# Patient Record
Sex: Female | Born: 1975
Health system: Southern US, Community
[De-identification: ages and names within clinical notes are randomized; demographics above are authoritative.]

## PROBLEM LIST (undated history)

## (undated) DIAGNOSIS — F329 Major depressive disorder, single episode, unspecified: Secondary | ICD-10-CM

## (undated) DIAGNOSIS — J45909 Unspecified asthma, uncomplicated: Secondary | ICD-10-CM

## (undated) DIAGNOSIS — F419 Anxiety disorder, unspecified: Secondary | ICD-10-CM

## (undated) DIAGNOSIS — A749 Chlamydial infection, unspecified: Secondary | ICD-10-CM

## (undated) DIAGNOSIS — I10 Essential (primary) hypertension: Secondary | ICD-10-CM

## (undated) DIAGNOSIS — R87619 Unspecified abnormal cytological findings in specimens from cervix uteri: Secondary | ICD-10-CM

## (undated) DIAGNOSIS — E7800 Pure hypercholesterolemia, unspecified: Secondary | ICD-10-CM

## (undated) DIAGNOSIS — F32A Depression, unspecified: Secondary | ICD-10-CM

## (undated) DIAGNOSIS — M1990 Unspecified osteoarthritis, unspecified site: Secondary | ICD-10-CM

## (undated) DIAGNOSIS — N83209 Unspecified ovarian cyst, unspecified side: Secondary | ICD-10-CM

## (undated) DIAGNOSIS — N3281 Overactive bladder: Secondary | ICD-10-CM

## (undated) HISTORY — DX: Pure hypercholesterolemia, unspecified: E78.00

## (undated) HISTORY — DX: Overactive bladder: N32.81

## (undated) HISTORY — DX: Essential (primary) hypertension: I10

## (undated) HISTORY — DX: Unspecified asthma, uncomplicated: J45.909

## (undated) HISTORY — PX: TUBAL LIGATION: SHX77

## (undated) HISTORY — DX: Unspecified osteoarthritis, unspecified site: M19.90

## (undated) HISTORY — DX: Anxiety disorder, unspecified: F41.9

## (undated) HISTORY — DX: Major depressive disorder, single episode, unspecified: F32.9

## (undated) HISTORY — DX: Unspecified ovarian cyst, unspecified side: N83.209

## (undated) HISTORY — DX: Depression, unspecified: F32.A

## (undated) HISTORY — DX: Unspecified abnormal cytological findings in specimens from cervix uteri: R87.619

## (undated) HISTORY — DX: Chlamydial infection, unspecified: A74.9

---
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
## 2015-02-10 LAB — HM MAMMOGRAPHY

## 2016-01-22 DIAGNOSIS — A749 Chlamydial infection, unspecified: Secondary | ICD-10-CM

## 2016-01-22 HISTORY — DX: Chlamydial infection, unspecified: A74.9

## 2016-02-11 LAB — HM PAP SMEAR

## 2016-09-15 ENCOUNTER — Ambulatory Visit (INDEPENDENT_AMBULATORY_CARE_PROVIDER_SITE_OTHER): Payer: BLUE CROSS/BLUE SHIELD | Admitting: Obstetrics and Gynecology

## 2016-09-15 ENCOUNTER — Encounter: Payer: Self-pay | Admitting: Obstetrics and Gynecology

## 2016-09-15 VITALS — BP 112/82 | HR 117 | Ht 64.0 in | Wt 158.0 lb

## 2016-09-15 DIAGNOSIS — Z113 Encounter for screening for infections with a predominantly sexual mode of transmission: Secondary | ICD-10-CM

## 2016-09-15 DIAGNOSIS — R1032 Left lower quadrant pain: Secondary | ICD-10-CM

## 2016-09-15 NOTE — Progress Notes (Signed)
Chief Complaint  Patient presents with  . Pelvic Pain    left side pain x2 weeks    HPI:      Ms. Tammy Marshall is a 41 y.o. [id number] who LMP was Patient's last menstrual period was 08/25/2016., presents today for LLQ throbbing pain for the past 2 wks. It is constant, but the intensity is less today than a few days ago. She denies any urin, vag, or GI sx. She had GI bug a few days before sx started, however, and still doesn't feel "right". She vomited randomly a couple days ago. No fevers. No hx of ovar cysts. No aggrav sx, sx improved with NSAIDs.  She is sex active, s/p TL, no new sex partners, but had chlamydia dx at 12/17 annual and was treated with azithro. Never had TOC.  Menses are monthly, last 7 days, occas mid cycle spotting, pos dysmen relieved with naproxen.    Past Medical History:  Diagnosis Date  . Abnormal Pap smear of cervix   . Asthma   . Chlamydia 01/2016  . Depression   . Hypercholesterolemia   . Hypertension   . Overactive bladder     Past Surgical History:  Procedure Laterality Date  . DILATION AND CURETTAGE OF UTERUS  1995  . TUBAL LIGATION      Family History  Problem Relation Age of Onset  . Arthritis Mother   . Asthma Mother   . Cancer Mother        cervical  . Cancer Father        bladder, unknown  . Hyperlipidemia Father   . Hypertension Father   . Heart attack Father        old/healed  . Arthritis Maternal Grandmother   . Arthritis Paternal Grandmother   . Cancer Paternal Grandmother        unknown    Social History   Social History  . Marital status: Single    Spouse name: N/A  . Number of children: 2  . Years of education: N/A   Occupational History  . Not on file.   Social History Main Topics  . Smoking status: Current Every Day Smoker    Types: Cigarettes  . Smokeless tobacco: Never Used  . Alcohol use Yes     Comment: 1-4/wk  . Drug use: No  . Sexual activity: Yes    Birth control/ protection: Surgical   Comment: tubal ligartion   Other Topics Concern  . Not on file   Social History Narrative  . No narrative on file    No current outpatient prescriptions on file.   ROS:  Review of Systems  Constitutional: Negative for fever.  Gastrointestinal: Positive for diarrhea, nausea and vomiting. Negative for blood in stool and constipation.  Genitourinary: Positive for frequency, pelvic pain and vaginal bleeding. Negative for dyspareunia, dysuria, flank pain, hematuria, urgency, vaginal discharge and vaginal pain.  Musculoskeletal: Negative for back pain.  Skin: Negative for rash.     OBJECTIVE:   Vitals:  BP 112/82 (BP Location: Left Arm, Patient Position: Sitting, Cuff Size: Normal)   Pulse (!) 117   Ht 5\' 4"  (1.626 m)   Wt 158 lb (71.7 kg)   LMP 08/25/2016   BMI 27.12 kg/m   Physical Exam  Constitutional: She is oriented to person, place, and time and well-developed, well-nourished, and in no distress. Vital signs are normal.  Abdominal: Soft. Normal appearance. There is tenderness in the left lower quadrant. There is no rigidity and  no guarding.  Genitourinary: Vagina normal, uterus normal, cervix normal, right adnexa normal and vulva normal. Uterus is not enlarged. Cervix exhibits no motion tenderness and no tenderness. Right adnexum displays no mass and no tenderness. Left adnexum displays tenderness. Left adnexum displays no mass. Vulva exhibits no erythema, no exudate, no lesion, no rash and no tenderness. Vagina exhibits no lesion.  Neurological: She is oriented to person, place, and time.  Vitals reviewed.    Assessment/Plan: LLQ pain - Tender on exam. Sx improving. Check u/s. Will f/u with resutls. Check nuswab. If all neg, question GI due to viral gastroenteritis before sx started.  - Plan: US Transvaginal Non-OB  Screening for STD (sexually transmitted disease) - Will f/u with results.  - Plan: Chlamydia/Gonococcus/Trichomonas, NAA     Return in about 1 day  (around 09/16/2016) for GYN u/s for LLQ pain--[facility] to call pt.  Oleva Koo B. Bassy Fetterly, PA-C 09/15/2016 4:40 PM

## 2016-09-16 ENCOUNTER — Telehealth: Payer: Self-pay | Admitting: Obstetrics and Gynecology

## 2016-09-16 ENCOUNTER — Ambulatory Visit (INDEPENDENT_AMBULATORY_CARE_PROVIDER_SITE_OTHER): Payer: BLUE CROSS/BLUE SHIELD

## 2016-09-16 DIAGNOSIS — R1032 Left lower quadrant pain: Secondary | ICD-10-CM | POA: Diagnosis not present

## 2016-09-16 NOTE — Telephone Encounter (Signed)
Pt aware of LTO cyst on u/s. Will repeat in 8 wks. NSAIDs. Pain improving anyway. F/u sooner prn.

## 2016-09-17 LAB — CHLAMYDIA/GONOCOCCUS/TRICHOMONAS, NAA
CHLAMYDIA BY NAA: NEGATIVE
Gonococcus by NAA: NEGATIVE
Trich vag by NAA: NEGATIVE

## 2016-12-19 DIAGNOSIS — S6392XA Sprain of unspecified part of left wrist and hand, initial encounter: Secondary | ICD-10-CM | POA: Diagnosis not present

## 2016-12-19 DIAGNOSIS — Z3202 Encounter for pregnancy test, result negative: Secondary | ICD-10-CM | POA: Diagnosis not present

## 2017-02-17 ENCOUNTER — Ambulatory Visit (INDEPENDENT_AMBULATORY_CARE_PROVIDER_SITE_OTHER): Payer: BLUE CROSS/BLUE SHIELD | Admitting: Maternal Newborn

## 2017-02-17 ENCOUNTER — Encounter: Payer: Self-pay | Admitting: Maternal Newborn

## 2017-02-17 ENCOUNTER — Other Ambulatory Visit: Payer: Self-pay

## 2017-02-17 VITALS — BP 112/78 | HR 80 | Ht 64.0 in | Wt 160.0 lb

## 2017-02-17 DIAGNOSIS — Z1329 Encounter for screening for other suspected endocrine disorder: Secondary | ICD-10-CM | POA: Diagnosis not present

## 2017-02-17 DIAGNOSIS — Z1231 Encounter for screening mammogram for malignant neoplasm of breast: Secondary | ICD-10-CM | POA: Diagnosis not present

## 2017-02-17 DIAGNOSIS — Z01419 Encounter for gynecological examination (general) (routine) without abnormal findings: Secondary | ICD-10-CM

## 2017-02-17 DIAGNOSIS — Z720 Tobacco use: Secondary | ICD-10-CM | POA: Insufficient documentation

## 2017-02-17 DIAGNOSIS — Z124 Encounter for screening for malignant neoplasm of cervix: Secondary | ICD-10-CM

## 2017-02-17 DIAGNOSIS — Z113 Encounter for screening for infections with a predominantly sexual mode of transmission: Secondary | ICD-10-CM

## 2017-02-17 NOTE — Progress Notes (Signed)
Gynecology Annual Exam  PCP: Patient, No Pcp Per  Chief Complaint:  Chief Complaint  Patient presents with  . Gynecologic Exam    No complaints    History of Present Illness: Patient is a 41 y.o. [id number] presenting for an annual exam. The patient has no complaints today.   LMP: Patient's last menstrual period was 02/04/2017. Average Interval: regular, 28 days Duration of flow: 5-7 days Heavy Menses: yes Clots: yes Intermenstrual Bleeding: yes, she is occasionally having cycles that don't last 28 days ( 2 cycles in one month) Postcoital Bleeding: no Dysmenorrhea: yes   The patient is sexually active. She currently uses tubal ligation for contraception. She denies dyspareunia.  The patient does not perform self breast exams.  There is no notable family history of breast or ovarian cancer in her family.  The patient wears seatbelts: yes.   The patient has regular exercise: no.    The patient denies current symptoms of depression.    Review of Systems  Constitutional: Negative for chills, fever and weight loss.  HENT: Positive for congestion. Negative for sinus pain and sore throat.   Eyes: Negative.   Respiratory: Positive for cough. Negative for shortness of breath and wheezing.   Cardiovascular: Negative for chest pain and palpitations.  Gastrointestinal: Negative for abdominal pain, constipation, diarrhea, heartburn and nausea.  Genitourinary: Negative for dysuria, frequency and urgency.  Musculoskeletal: Negative.   Skin: Negative.   Neurological: Negative.   Endo/Heme/Allergies: Negative.   Psychiatric/Behavioral: Negative.   All other systems reviewed and are negative.   Past Medical History:  Past Medical History:  Diagnosis Date  . Abnormal Pap smear of cervix   . Asthma   . Chlamydia 01/2016  . Depression   . Hypercholesterolemia   . Hypertension   . Overactive bladder     Past Surgical History:  Past Surgical History:  Procedure Laterality Date  .  DILATION AND CURETTAGE OF UTERUS  1995  . TUBAL LIGATION      Gynecologic History:  Patient's last menstrual period was 02/04/2017. Contraception: tubal ligation Last Pap: 02/11/2016. Results were: NIL and HR HPV negative  Last mammogram: 02/10/2015.  Results were: BI-RAD I Obstetric History: [id number]: G4P2022  Family History:  Family History  Problem Relation Age of Onset  . Arthritis Mother   . Asthma Mother   . Cancer Mother        cervical  . Cancer Father        bladder, unknown  . Hyperlipidemia Father   . Hypertension Father   . Heart attack Father        old/healed  . Arthritis Maternal Grandmother   . Arthritis Paternal Grandmother   . Cancer Paternal Grandmother        unknown  . Brain cancer Paternal Aunt [id number]  . Brain cancer Maternal Grandfather [id number]    Social History:  Social History   Socioeconomic History  . Marital status: Single    Spouse name: Not on file  . Number of children: 2  . Years of education: Not on file  . Highest education level: Not on file  Social Needs  . Financial resource strain: Not on file  . Food insecurity - worry: Not on file  . Food insecurity - inability: Not on file  . Transportation needs - medical: Not on file  . Transportation needs - non-medical: Not on file  Occupational History  . Not on file  Tobacco Use  . Smoking status: Current Every Day Smoker  Types: Cigarettes  . Smokeless tobacco: Never Used  Substance and Sexual Activity  . Alcohol use: Yes    Comment: 1-4/wk  . Drug use: No  . Sexual activity: Yes    Birth control/protection: Surgical    Comment: tubal ligartion  Other Topics Concern  . Not on file  Social History Narrative  . Not on file    Allergies:  No Known Allergies  Medications: Prior to Admission medications   Not on File    Physical Exam Vitals: Blood pressure 112/78, pulse 80, height 5\' 4"  (1.626 m), weight 160 lb (72.6 kg), last menstrual period 02/04/2017.  General: NAD HEENT:  normocephalic, anicteric Thyroid: enlargement on left side, no palpable nodules Pulmonary: No increased work of breathing, CTAB Cardiovascular: RRR, distal pulses 2+ Breast: Breasts symmetrical, no tenderness, no palpable nodules or masses, no skin or nipple retraction present, no nipple discharge.  No axillary or supraclavicular lymphadenopathy. Abdomen: NABS, soft, non-tender, non-distended.  Umbilicus without lesions.  No hepatomegaly, splenomegaly or masses palpable. No evidence of hernia  Genitourinary:  External: Normal external female genitalia.  Normal urethral  meatus, normal Bartholin's and Skene's glands.    Vagina: Normal vaginal mucosa, no evidence of prolapse.    Cervix: Grossly normal in appearance, no bleeding  Uterus: Non-enlarged, mobile, normal contour.  No CMT  Adnexa: ovaries non-enlarged, no adnexal masses  Rectal: deferred  Lymphatic: no evidence of inguinal lymphadenopathy Extremities: no edema, erythema, or tenderness Neurologic: Grossly intact Psychiatric: mood appropriate, affect full  Assessment: 41 y.o. [id number], routine annual exam and STD testing.  Plan: Problem List Items Addressed This Visit    Tobacco abuse    Other Visit Diagnoses    Encounter for annual routine gynecological examination    -  Primary   Encounter for screening mammogram for breast cancer       Relevant Orders   MM DIGITAL SCREENING BILATERAL   Screen for STD (sexually transmitted disease)       Relevant Orders   Pap IG, Ct-Ng TV HPV-hr   HEP, RPR, HIV Panel   Hepatitis C antibody   Pap smear for cervical cancer screening       Relevant Orders   Pap IG, Ct-Ng TV HPV-hr   Screening for thyroid disorder       Relevant Orders   Thyroid Cascade Profile      1) Mammogram - recommend yearly screening mammogram.  Mammogram was ordered today and patient given card to call [facility] for appointment.  2) STI screening was offered and accepted.  3) ASCCP guidelines and rationale  discussed.  Patient opts for yearly screening interval.  4) Contraception - She has had a BTL.  5) Colonoscopy -- Screening recommended starting at age 50 for average risk individuals, age 93 for individuals deemed at increased risk (including African Americans) and recommended to continue until age 54.  For patient age 51-85 individualized approach is recommended.  Gold standard screening is via colonoscopy, Cologuard screening is an acceptable alternative for patient unwilling or unable to undergo colonoscopy.  "Colorectal cancer screening for average?risk adults: 2018 guideline update from the American Cancer Society"CA: A Cancer Journal for Clinicians: Jul 20, 2016   6) Routine healthcare maintenance including cholesterol, diabetes screening discussed; declines.  7) Thyroid panel due to diffuse enlargement on the left side on today's exam.  8) Does not currently desire pharmacological intervention for occasional irregularities in menstrual cycle.  Return in about 1 year (around 02/17/2018) for Annual exam.   Dillard Cannon  Bridgette HabermannSchmid, [facility] HospitalCNM 02/17/2017  9:43 AM

## 2017-02-18 LAB — THYROID CASCADE PROFILE: TSH: 2.55 u[IU]/mL (ref 0.450–4.500)

## 2017-02-18 LAB — HEPATITIS C ANTIBODY: Hep C Virus Ab: <0.1 s/co ratio (ref 0.0–0.9)

## 2017-02-18 LAB — HEP, RPR, HIV PANEL
HIV SCREEN 4TH GENERATION: NONREACTIVE
Hepatitis B Surface Ag: NEGATIVE
RPR: NONREACTIVE

## 2017-02-22 LAB — PAP IG, CT-NG TV HPV-HR
Chlamydia, Nuc. Acid Amp: NEGATIVE
GONOCOCCUS, NUC. ACID AMP: NEGATIVE
HPV, HIGH-RISK: NEGATIVE
PAP SMEAR COMMENT: 0
TRICH VAG BY NAA: NEGATIVE

## 2017-03-30 DIAGNOSIS — J111 Influenza due to unidentified influenza virus with other respiratory manifestations: Secondary | ICD-10-CM | POA: Diagnosis not present

## 2017-03-30 DIAGNOSIS — J029 Acute pharyngitis, unspecified: Secondary | ICD-10-CM | POA: Diagnosis not present

## 2017-04-03 ENCOUNTER — Ambulatory Visit: Admission: RE | Admit: 2017-04-03 | Payer: Self-pay | Source: Ambulatory Visit

## 2017-04-12 ENCOUNTER — Ambulatory Visit
Admission: RE | Admit: 2017-04-12 | Discharge: 2017-04-12 | Disposition: A | Discharge status: Home / Self Care | Payer: BLUE CROSS/BLUE SHIELD | Source: Ambulatory Visit | Attending: Maternal Newborn | Admitting: Maternal Newborn

## 2017-04-12 DIAGNOSIS — Z1231 Encounter for screening mammogram for malignant neoplasm of breast: Secondary | ICD-10-CM | POA: Diagnosis not present

## 2017-04-18 ENCOUNTER — Inpatient Hospital Stay
Admission: RE | Admit: 2017-04-18 | Discharge: 2017-04-18 | Disposition: A | Discharge status: Home / Self Care | Payer: Self-pay | Source: Ambulatory Visit | Attending: *Deleted | Admitting: *Deleted

## 2017-04-18 ENCOUNTER — Other Ambulatory Visit: Payer: Self-pay | Admitting: *Deleted

## 2017-04-18 DIAGNOSIS — Z9289 Personal history of other medical treatment: Secondary | ICD-10-CM

## 2017-04-19 DIAGNOSIS — R200 Anesthesia of skin: Secondary | ICD-10-CM | POA: Diagnosis not present

## 2017-04-19 DIAGNOSIS — G514 Facial myokymia: Secondary | ICD-10-CM | POA: Diagnosis not present

## 2017-04-20 DIAGNOSIS — R200 Anesthesia of skin: Secondary | ICD-10-CM | POA: Insufficient documentation

## 2017-04-20 DIAGNOSIS — G514 Facial myokymia: Secondary | ICD-10-CM | POA: Insufficient documentation

## 2017-05-08 DIAGNOSIS — G5603 Carpal tunnel syndrome, bilateral upper limbs: Secondary | ICD-10-CM | POA: Diagnosis not present

## 2017-05-09 DIAGNOSIS — G5603 Carpal tunnel syndrome, bilateral upper limbs: Secondary | ICD-10-CM | POA: Insufficient documentation

## 2017-05-22 DIAGNOSIS — G514 Facial myokymia: Secondary | ICD-10-CM | POA: Diagnosis not present

## 2017-05-22 DIAGNOSIS — G5603 Carpal tunnel syndrome, bilateral upper limbs: Secondary | ICD-10-CM | POA: Diagnosis not present

## 2017-05-22 DIAGNOSIS — R200 Anesthesia of skin: Secondary | ICD-10-CM | POA: Diagnosis not present

## 2017-06-21 DIAGNOSIS — Z0000 Encounter for general adult medical examination without abnormal findings: Secondary | ICD-10-CM | POA: Diagnosis not present

## 2017-06-21 DIAGNOSIS — R531 Weakness: Secondary | ICD-10-CM | POA: Diagnosis not present

## 2017-06-21 DIAGNOSIS — Z0001 Encounter for general adult medical examination with abnormal findings: Secondary | ICD-10-CM | POA: Diagnosis not present

## 2017-06-21 DIAGNOSIS — R5383 Other fatigue: Secondary | ICD-10-CM | POA: Diagnosis not present

## 2017-08-16 ENCOUNTER — Ambulatory Visit: Payer: BLUE CROSS/BLUE SHIELD | Admitting: Obstetrics and Gynecology

## 2017-08-16 ENCOUNTER — Encounter: Payer: Self-pay | Admitting: Obstetrics and Gynecology

## 2017-08-16 VITALS — BP 120/70 | Ht 64.0 in | Wt 156.0 lb

## 2017-08-16 DIAGNOSIS — K625 Hemorrhage of anus and rectum: Secondary | ICD-10-CM | POA: Insufficient documentation

## 2017-08-16 DIAGNOSIS — K5900 Constipation, unspecified: Secondary | ICD-10-CM | POA: Insufficient documentation

## 2017-08-16 DIAGNOSIS — N83202 Unspecified ovarian cyst, left side: Secondary | ICD-10-CM | POA: Diagnosis not present

## 2017-08-16 DIAGNOSIS — Z800 Family history of malignant neoplasm of digestive organs: Secondary | ICD-10-CM | POA: Insufficient documentation

## 2017-08-16 DIAGNOSIS — R1032 Left lower quadrant pain: Secondary | ICD-10-CM

## 2017-08-16 DIAGNOSIS — Z1211 Encounter for screening for malignant neoplasm of colon: Secondary | ICD-10-CM

## 2017-08-16 LAB — HEMOCCULT GUIAC POC 1CARD (OFFICE): FECAL OCCULT BLD: NEGATIVE

## 2017-08-16 NOTE — Progress Notes (Signed)
Thornton Papas, PA-C   Chief Complaint  Patient presents with  . Ovarian Cyst    HPI:      Ms. Tammy Marshall is a 42 y.o. [id number] who LMP was Patient's last menstrual period was 08/09/2017., presents today for issues with constipation. Pt has a BM daily but it's hard balls and sometimes pt can't get it out. She has to use her finger or an enema to remove it. She has a hx of hemorrhoids and sometimes has blood with wiping and/or on stool.  Sx have been going on for 4-5 months. She tried metamucil daily without relief. She is now taking magnesium supp daily with a small improvement. There is a FH of colon and pancreatic cancer in her dad. Genetic testing not done.  Pt  had 3.0 x 2.5 cm LTO cyst 7/18; never had u/s f/u. Has pain LLQ with menses only. No pelvic pain otherwise.  Pt on gabapentin for eye twitching with neuro, but sx haven't improved and is extremely fatigued with med. Has neuro f/u 7/19.   Annual done 12/18.  Past Medical History:  Diagnosis Date  . Abnormal Pap smear of cervix   . Asthma   . Chlamydia 01/2016  . Depression   . Hypercholesterolemia   . Hypertension   . Overactive bladder     Past Surgical History:  Procedure Laterality Date  . DILATION AND CURETTAGE OF UTERUS  1995  . TUBAL LIGATION      Family History  Problem Relation Age of Onset  . Arthritis Mother   . Asthma Mother   . Cancer Mother        cervical  . Hyperlipidemia Father   . Hypertension Father   . Heart attack Father        old/healed  . Colon cancer Father 40  . Pancreatic cancer Father 59  . Arthritis Maternal Grandmother   . Arthritis Paternal Grandmother   . Cancer Paternal Grandmother        unknown  . Brain cancer Paternal Aunt 70  . Brain cancer Maternal Grandfather 30    Social History   Socioeconomic History  . Marital status: Single    Spouse name: Not on file  . Number of children: 2  . Years of education: Not on file  . Highest education level: Not  on file  Occupational History  . Not on file  Social Needs  . Financial resource strain: Not on file  . Food insecurity:    Worry: Not on file    Inability: Not on file  . Transportation needs:    Medical: Not on file    Non-medical: Not on file  Tobacco Use  . Smoking status: Current Every Day Smoker    Types: Cigarettes  . Smokeless tobacco: Never Used  Substance and Sexual Activity  . Alcohol use: Yes    Comment: 1-4/wk  . Drug use: No  . Sexual activity: Yes    Birth control/protection: Surgical    Comment: tubal ligartion  Lifestyle  . Physical activity:    Days per week: 0 days    Minutes per session: Not on file  . Stress: Rather much  Relationships  . Social connections:    Talks on phone: Twice a week    Gets together: Once a week    Attends religious service: Never    Active member of club or organization: No    Attends meetings of clubs or organizations: Never  Relationship status: Divorced  . Intimate partner violence:    Fear of current or ex partner: Patient refused    Emotionally abused: Patient refused    Physically abused: Patient refused    Forced sexual activity: Patient refused  Other Topics Concern  . Not on file  Social History Narrative  . Not on file    Outpatient Medications Prior to Visit  Medication Sig Dispense Refill  . gabapentin (NEURONTIN) 100 MG capsule   2  . Multiple Vitamin (MULTI-VITAMINS) TABS Take by mouth.     No facility-administered medications prior to visit.     ROS:  Review of Systems  Constitutional: Positive for fatigue. Negative for fever.  Gastrointestinal: Positive for anal bleeding and constipation. Negative for blood in stool, diarrhea, nausea and vomiting.  Genitourinary: Negative for dyspareunia, dysuria, flank pain, frequency, hematuria, urgency, vaginal bleeding, vaginal discharge and vaginal pain.  Musculoskeletal: Negative for back pain.  Skin: Negative for rash.    OBJECTIVE:   Vitals:  BP  120/70   Ht 5\' 4"  (1.626 m)   Wt 156 lb (70.8 kg)   LMP 08/09/2017   BMI 26.78 kg/m   Physical Exam  Constitutional: She is oriented to person, place, and time. Vital signs are normal. She appears well-developed.  Pulmonary/Chest: Effort normal.  Genitourinary: Vagina normal and uterus normal. Rectal exam shows no external hemorrhoid, no fissure, no tenderness and guaiac negative stool. There is no rash, tenderness or lesion on the right labia. There is no rash, tenderness or lesion on the left labia. Uterus is not enlarged and not tender. Cervix exhibits no motion tenderness. Right adnexum displays no mass and no tenderness. Left adnexum displays no mass and no tenderness. No erythema or tenderness in the vagina. No vaginal discharge found.  Musculoskeletal: Normal range of motion.  Neurological: She is alert and oriented to person, place, and time.  Psychiatric: She has a normal mood and affect. Her behavior is normal. Thought content normal.  Vitals reviewed.   Results: Results for orders placed or performed in visit on 08/16/17 (from the past 24 hour(s))  POCT Occult Blood Stool     Status: Normal   Collection Time: 08/16/17  9:21 AM  Result Value Ref Range   Fecal Occult Blood, POC Negative Negative   Card #1 Date     Card #2 Fecal Occult Blod, POC     Card #2 Date     Card #3 Fecal Occult Blood, POC     Card #3 Date       Assessment/Plan: Constipation, unspecified constipation type - Try benefiber/lots of water/add colace/cont Mg supp. Refer to GI for further eval/mgmt.  Screening for colon cancer - Refer to GI for colonoscopy given rectal bleeding with BM and FH colon cancer in her dad. - Plan: Ambulatory referral to Gastroenterology  Rectal bleeding - Plan: Ambulatory referral to Gastroenterology, POCT Occult Blood Stool  Family history of pancreatic cancer - [facility] cancer genetic testing discussed and declined. Handout given. F/u prn.   Family history of colon cancer  - Plan: Ambulatory referral to Gastroenterology  Cyst of left ovary - 7/18. F/u u/s not done but pt's sx resolved. Neg exam today. F/u prn.     Return if symptoms worsen or fail to improve.  Gradie Butrick B. Earnesteen Birnie, PA-C 08/16/2017 9:27 AM

## 2017-08-16 NOTE — Patient Instructions (Signed)
I value your feedback and entrusting us with your care. If you get a [facility] patient survey, I would appreciate you taking the time to let us know about your experience today. Thank you! 

## 2017-08-30 DIAGNOSIS — R253 Fasciculation: Secondary | ICD-10-CM | POA: Diagnosis not present

## 2017-08-30 DIAGNOSIS — G514 Facial myokymia: Secondary | ICD-10-CM | POA: Diagnosis not present

## 2017-08-30 DIAGNOSIS — R200 Anesthesia of skin: Secondary | ICD-10-CM | POA: Diagnosis not present

## 2017-08-30 DIAGNOSIS — M542 Cervicalgia: Secondary | ICD-10-CM | POA: Diagnosis not present

## 2017-09-04 DIAGNOSIS — M542 Cervicalgia: Secondary | ICD-10-CM | POA: Insufficient documentation

## 2017-09-04 DIAGNOSIS — R253 Fasciculation: Secondary | ICD-10-CM | POA: Insufficient documentation

## 2017-10-31 ENCOUNTER — Ambulatory Visit: Payer: BLUE CROSS/BLUE SHIELD | Admitting: Gastroenterology

## 2017-10-31 ENCOUNTER — Encounter: Payer: Self-pay | Admitting: Gastroenterology

## 2017-10-31 ENCOUNTER — Other Ambulatory Visit: Payer: Self-pay

## 2017-10-31 VITALS — BP 103/67 | HR 103 | Ht 64.0 in | Wt 159.8 lb

## 2017-10-31 DIAGNOSIS — R1084 Generalized abdominal pain: Secondary | ICD-10-CM | POA: Diagnosis not present

## 2017-10-31 DIAGNOSIS — K5904 Chronic idiopathic constipation: Secondary | ICD-10-CM | POA: Diagnosis not present

## 2017-10-31 NOTE — Progress Notes (Signed)
Gastroenterology Consultation  Referring Provider:     Rica Records, PA-C Primary Care Physician:  Thornton Papas, PA-C Primary Gastroenterologist:  Dr. Servando Snare     Reason for Consultation:     Constipation        HPI:   Tammy Marshall is a 42 y.o. y/o female referred for consultation & management of Constipation by Dr. Gerilyn Pilgrim, Heidi Dach, PA-C.  This patient comes in today with a history of abdominal bloating with constipation.  The patient states that she has also suffered from her hemorrhoids for sometime.  The patient reports that she was taking a laxative and reports that she still passes small hard stools.  The patient states that this is been going on for years.  She does report that she has a family history of colon cancer in her father. There is no report of any unexplained weight loss or black stools.  The patient reports that her abdominal discomfort is most commonly associated with her being constipated.  The patient also states that she gets diarrhea from her laxative.  Past Medical History:  Diagnosis Date  . Abnormal Pap smear of cervix   . Asthma   . Chlamydia 01/2016  . Depression   . Hypercholesterolemia   . Hypertension   . Overactive bladder     Past Surgical History:  Procedure Laterality Date  . DILATION AND CURETTAGE OF UTERUS  1995  . TUBAL LIGATION      Prior to Admission medications   Medication Sig Start Date End Date Taking? Authorizing Provider  clonazePAM (KLONOPIN) 0.5 MG tablet  10/29/17  Yes [provider]  Multiple Vitamin (MULTI-VITAMINS) TABS Take by mouth.   Yes [provider]  gabapentin (NEURONTIN) 100 MG capsule  06/26/17   [provider]    Family History  Problem Relation Age of Onset  . Arthritis Mother   . Asthma Mother   . Cancer Mother        cervical  . Hyperlipidemia Father   . Hypertension Father   . Heart attack Father        old/healed  . Colon cancer Father 81  . Pancreatic cancer  Father 54  . Arthritis Maternal Grandmother   . Arthritis Paternal Grandmother   . Cancer Paternal Grandmother        unknown  . Brain cancer Paternal Aunt 74  . Brain cancer Maternal Grandfather 29     Social History   Tobacco Use  . Smoking status: Current Every Day Smoker    Types: Cigarettes  . Smokeless tobacco: Never Used  Substance Use Topics  . Alcohol use: Yes    Comment: 1-4/wk  . Drug use: No    Allergies as of 10/31/2017  . (No Known Allergies)    Review of Systems:    All systems reviewed and negative except where noted in HPI.   Physical Exam:  BP 103/67   Pulse (!) 103   Ht 5\' 4"  (1.626 m)   Wt 159 lb 12.8 oz (72.5 kg)   BMI 27.43 kg/m  No LMP recorded. General:   Alert,  Well-developed, well-nourished, pleasant and cooperative in NAD Head:  Normocephalic and atraumatic. Eyes:  Sclera clear, no icterus.   Conjunctiva pink. Ears:  Normal auditory acuity. Nose:  No deformity, discharge, or lesions. Mouth:  No deformity or lesions,oropharynx pink & moist. Neck:  Supple; no masses or thyromegaly. Lungs:  Respirations even and unlabored.  Clear throughout to auscultation.  No wheezes, crackles, or rhonchi. No acute distress. Heart:  Regular rate and rhythm; no murmurs, clicks, rubs, or gallops. Abdomen:  Normal bowel sounds.  No bruits.  Soft, non-tender and non-distended without masses, hepatosplenomegaly or hernias noted.  No guarding or rebound tenderness.  Negative Carnett sign.   Rectal:  Deferred.  Msk:  Symmetrical without gross deformities.  Good, equal movement & strength bilaterally. Pulses:  Normal pulses noted. Extremities:  No clubbing or edema.  No cyanosis. Neurologic:  Alert and oriented x3;  grossly normal neurologically. Skin:  Intact without significant lesions or rashes.  No jaundice. Lymph Nodes:  No significant cervical adenopathy. Psych:  Alert and cooperative. Normal mood and affect.  Imaging Studies: No results  found.  Assessment and Plan:   Tammy Marshall is a 42 y.o. y/o female who comes in with a history of constipation some hemorrhoidal bleeding and Abdominal discomfort.  The patient has been set up for colonoscopy due to her family history of colon cancer and she has also been given samples of Linzess 72 g and 145 g.  The patient will let me know if these help her symptoms when she comes for her colonoscopy. I have discussed risks & benefits which include, but are not limited to, bleeding, infection, perforation & drug reaction.  The patient agrees with this plan & written consent will be obtained.     Midge Minium, MD. Clementeen Graham    Note: This dictation was prepared with Dragon dictation along with smaller phrase technology. Any transcriptional errors that result from this process are unintentional.

## 2017-12-13 DIAGNOSIS — R253 Fasciculation: Secondary | ICD-10-CM | POA: Diagnosis not present

## 2017-12-13 DIAGNOSIS — R200 Anesthesia of skin: Secondary | ICD-10-CM | POA: Diagnosis not present

## 2017-12-21 DIAGNOSIS — R200 Anesthesia of skin: Secondary | ICD-10-CM | POA: Insufficient documentation

## 2018-01-03 DIAGNOSIS — D3131 Benign neoplasm of right choroid: Secondary | ICD-10-CM | POA: Diagnosis not present

## 2018-03-01 ENCOUNTER — Ambulatory Visit: Payer: BLUE CROSS/BLUE SHIELD | Admitting: Obstetrics and Gynecology

## 2018-03-18 NOTE — Progress Notes (Signed)
PCP:  Thornton PapasSykes, Larry Justain, PA-C   Chief Complaint  Patient presents with  . Gynecologic Exam     HPI:      Ms. Tammy Marshall is a 43 y.o. [id number] who LMP was Patient's last menstrual period was 01/21/2018 (approximate)., presents today for her annual examination.  Her menses are regular every 28-30 days, lasting 7 days.  Dysmenorrhea mild, occurring first 1-2 days of flow. She does not have intermenstrual bleeding.  Sex activity: single partner, contraception - tubal ligation.  Last Pap: February 17, 2017  Results were: no abnormalities /neg HPV DNA  Hx of STDs: chlamydia in past  Last mammogram: April 12, 2017  Results were: normal--routine follow-up in 12 months There is no FH of breast cancer. There is no FH of ovarian cancer. The patient does do self-breast exams.  Tobacco use: occas when under stress Alcohol use: none No drug use.  Exercise: not active  She does get adequate calcium and Vitamin D in her diet.  Pt with constipation 6/19 with occas rectal bleeding, hx of hemorrhoids. Referred to GI. Couldn't afford colonoscopy at that time. Given Rx for constipation. Sx slightly improved with colace, magnesium; less rectal bleeding. FH of colon cancer and pancreatic cancer in her dad. MyRisk testing declined 6/19.  Past Medical History:  Diagnosis Date  . Abnormal Pap smear of cervix   . Asthma   . Chlamydia 01/2016  . Depression   . Hypercholesterolemia   . Hypertension   . Ovarian cyst   . Overactive bladder     Past Surgical History:  Procedure Laterality Date  . DILATION AND CURETTAGE OF UTERUS  1995  . TUBAL LIGATION      Family History  Problem Relation Age of Onset  . Arthritis Mother   . Asthma Mother   . Cancer Mother        cervical  . Hyperlipidemia Father   . Hypertension Father   . Heart attack Father        old/healed  . Colon cancer Father [id number]  . Pancreatic cancer Father [id number]  . Arthritis Maternal Grandmother   . Arthritis Paternal  Grandmother   . Cancer Paternal Grandmother        unknown  . Brain cancer Paternal Aunt [id number]  . Brain cancer Maternal Grandfather [id number]    Social History   Socioeconomic History  . Marital status: Single    Spouse name: Not on file  . Number of children: 2  . Years of education: Not on file  . Highest education level: Not on file  Occupational History  . Not on file  Social Needs  . Financial resource strain: Not on file  . Food insecurity:    Worry: Not on file    Inability: Not on file  . Transportation needs:    Medical: Not on file    Non-medical: Not on file  Tobacco Use  . Smoking status: Current Some Day Smoker    Types: Cigarettes  . Smokeless tobacco: Never Used  Substance and Sexual Activity  . Alcohol use: Yes    Comment: 1-4/wk  . Drug use: No  . Sexual activity: Yes    Birth control/protection: Surgical    Comment: tubal ligartion  Lifestyle  . Physical activity:    Days per week: 0 days    Minutes per session: Not on file  . Stress: Rather much  Relationships  . Social connections:    Talks on phone: Twice a week  Gets together: Once a week    Attends religious service: Never    Active member of club or organization: No    Attends meetings of clubs or organizations: Never    Relationship status: Divorced  . Intimate partner violence:    Fear of current or ex partner: Patient refused    Emotionally abused: Patient refused    Physically abused: Patient refused    Forced sexual activity: Patient refused  Other Topics Concern  . Not on file  Social History Narrative  . Not on file    Outpatient Medications Prior to Visit  Medication Sig Dispense Refill  . Multiple Vitamin (MULTI-VITAMINS) TABS Take by mouth.    . clonazePAM (KLONOPIN) 0.5 MG tablet   0  . gabapentin (NEURONTIN) 100 MG capsule   2   No facility-administered medications prior to visit.       ROS:  Review of Systems  Constitutional: Positive for fatigue. Negative for  fever and unexpected weight change.  Respiratory: Negative for cough, shortness of breath and wheezing.   Cardiovascular: Negative for chest pain, palpitations and leg swelling.  Gastrointestinal: Negative for blood in stool, constipation, diarrhea, nausea and vomiting.  Endocrine: Negative for cold intolerance, heat intolerance and polyuria.  Genitourinary: Negative for dyspareunia, dysuria, flank pain, frequency, genital sores, hematuria, menstrual problem, pelvic pain, urgency, vaginal bleeding, vaginal discharge and vaginal pain.  Musculoskeletal: Negative for back pain, joint swelling and myalgias.  Skin: Negative for rash.  Neurological: Negative for dizziness, syncope, light-headedness, numbness and headaches.  Hematological: Negative for adenopathy.  Psychiatric/Behavioral: Positive for dysphoric mood. Negative for agitation, confusion, decreased concentration, sleep disturbance and suicidal ideas. The patient is not nervous/anxious.    BREAST: No symptoms   Objective: BP 110/78   Pulse 92   Ht 5\' 4"  (1.626 m)   Wt 168 lb (76.2 kg)   LMP 01/21/2018 (Approximate)   BMI 28.84 kg/m    Physical Exam Constitutional:      Appearance: She is well-developed.  Genitourinary:     Vulva, vagina, cervix, uterus, right adnexa and left adnexa normal.     No vulval lesion or tenderness noted.     No vaginal discharge, erythema or tenderness.     No cervical polyp.     Uterus is not enlarged or tender.     No right or left adnexal mass present.     Right adnexa not tender.     Left adnexa not tender.  Neck:     Musculoskeletal: Normal range of motion.     Thyroid: No thyromegaly.  Cardiovascular:     Rate and Rhythm: Normal rate and regular rhythm.     Heart sounds: Normal heart sounds. No murmur.  Pulmonary:     Effort: Pulmonary effort is normal.     Breath sounds: Normal breath sounds.  Chest:     Breasts:        Right: No mass, nipple discharge, skin change or tenderness.          Left: No mass, nipple discharge, skin change or tenderness.  Abdominal:     Palpations: Abdomen is soft.     Tenderness: There is no abdominal tenderness. There is no guarding.  Musculoskeletal: Normal range of motion.  Neurological:     Mental Status: She is alert and oriented to person, place, and time.     Cranial Nerves: No cranial nerve deficit.  Psychiatric:        Behavior: Behavior normal.  Vitals signs  reviewed.     Assessment/Plan: Encounter for annual routine gynecological examination  Screening for breast cancer - Pt to sched mammo - Plan: MM 3D SCREEN BREAST BILATERAL  Constipation, unspecified constipation type - Sx slightly improved.   Rectal bleeding - Decreased frequency. Suggested f/u for colonoscopy when gets new ins in a few months to see if better coverage.  Family history of pancreatic cancer - MyRisk testing discussed and pt declines.   Family history of colon cancer          GYN counsel breast self exam, mammography screening, adequate intake of calcium and vitamin D, diet and exercise     F/U  Return in about 1 year (around 03/20/2019).  Soha Thorup B. Anjolie Majer, PA-C 03/19/2018 9:00 AM

## 2018-03-19 ENCOUNTER — Ambulatory Visit (INDEPENDENT_AMBULATORY_CARE_PROVIDER_SITE_OTHER): Payer: BLUE CROSS/BLUE SHIELD | Admitting: Obstetrics and Gynecology

## 2018-03-19 ENCOUNTER — Encounter: Payer: Self-pay | Admitting: Obstetrics and Gynecology

## 2018-03-19 VITALS — BP 110/78 | HR 92 | Ht 64.0 in | Wt 168.0 lb

## 2018-03-19 DIAGNOSIS — Z1239 Encounter for other screening for malignant neoplasm of breast: Secondary | ICD-10-CM

## 2018-03-19 DIAGNOSIS — Z800 Family history of malignant neoplasm of digestive organs: Secondary | ICD-10-CM

## 2018-03-19 DIAGNOSIS — K625 Hemorrhage of anus and rectum: Secondary | ICD-10-CM

## 2018-03-19 DIAGNOSIS — K5900 Constipation, unspecified: Secondary | ICD-10-CM

## 2018-03-19 DIAGNOSIS — Z01419 Encounter for gynecological examination (general) (routine) without abnormal findings: Secondary | ICD-10-CM

## 2018-03-19 NOTE — Patient Instructions (Signed)
I value your feedback and entrusting us with your care. If you get a [facility] patient survey, I would appreciate you taking the time to let us know about your experience today. Thank you!  [facility]: [phone]    

## 2018-06-18 DIAGNOSIS — R200 Anesthesia of skin: Secondary | ICD-10-CM | POA: Diagnosis not present

## 2018-06-18 DIAGNOSIS — M542 Cervicalgia: Secondary | ICD-10-CM | POA: Diagnosis not present

## 2018-06-18 DIAGNOSIS — G514 Facial myokymia: Secondary | ICD-10-CM | POA: Diagnosis not present

## 2019-02-22 DIAGNOSIS — R768 Other specified abnormal immunological findings in serum: Secondary | ICD-10-CM

## 2019-02-22 HISTORY — DX: Other specified abnormal immunological findings in serum: R76.8

## 2019-04-10 ENCOUNTER — Other Ambulatory Visit: Payer: Self-pay

## 2019-04-10 ENCOUNTER — Other Ambulatory Visit (HOSPITAL_COMMUNITY)
Admission: RE | Admit: 2019-04-10 | Discharge: 2019-04-10 | Disposition: A | Discharge status: Home / Self Care | Payer: BC Managed Care – PPO | Source: Ambulatory Visit | Attending: Obstetrics and Gynecology | Admitting: Obstetrics and Gynecology

## 2019-04-10 ENCOUNTER — Ambulatory Visit (INDEPENDENT_AMBULATORY_CARE_PROVIDER_SITE_OTHER): Payer: BC Managed Care – PPO | Admitting: Obstetrics and Gynecology

## 2019-04-10 ENCOUNTER — Encounter: Payer: Self-pay | Admitting: Obstetrics and Gynecology

## 2019-04-10 VITALS — BP 120/80 | Ht 64.0 in | Wt 191.0 lb

## 2019-04-10 DIAGNOSIS — Z113 Encounter for screening for infections with a predominantly sexual mode of transmission: Secondary | ICD-10-CM

## 2019-04-10 DIAGNOSIS — Z800 Family history of malignant neoplasm of digestive organs: Secondary | ICD-10-CM

## 2019-04-10 DIAGNOSIS — K625 Hemorrhage of anus and rectum: Secondary | ICD-10-CM

## 2019-04-10 DIAGNOSIS — Z01419 Encounter for gynecological examination (general) (routine) without abnormal findings: Secondary | ICD-10-CM

## 2019-04-10 DIAGNOSIS — Z1231 Encounter for screening mammogram for malignant neoplasm of breast: Secondary | ICD-10-CM

## 2019-04-10 LAB — HEMOCCULT GUIAC POC 1CARD (OFFICE): Fecal Occult Blood, POC: NEGATIVE

## 2019-04-10 NOTE — Progress Notes (Signed)
PCP:  Thornton Papas, PA-C   Chief Complaint  Patient presents with  . Gynecologic Exam     HPI:      Ms. Tammy Marshall is a 44 y.o. [id number] who LMP was Patient's last menstrual period was 03/23/2019 (exact date)., presents today for her annual examination.  Her menses are regular every 28-30 days, lasting 7 days.  Dysmenorrhea mild, improved with NSAIDs. She does not have intermenstrual bleeding.  Sex activity: single partner, contraception - tubal ligation.  Last Pap: February 17, 2017  Results were: no abnormalities /neg HPV DNA  Hx of STDs: chlamydia in past; wants full STD testing today to be safe; no sx/known exposures  Last mammogram: April 12, 2017  Results were: normal--routine follow-up in 12 months There is no FH of breast cancer. There is a FH of ovarian vs uterine cancer in her mom; genetic testing not done. The patient does do self-breast exams. There is a FH of pancreatic cancer in her pat uncle and colon cancer in her dad. She declined MyRisk testing last yr.   Tobacco use: quit last yr.  Alcohol use: none No drug use.  Exercise: very active; doing diet changes too to lose wt  She does get adequate calcium but not Vitamin D in her diet.  Pt with constipation 6/19 with occas rectal bleeding, hx of hemorrhoids. Referred to GI. Couldn't afford colonoscopy at that time. Constipation improved this yr with decreased rectal bleeding sx. Not interested in colonoscopy this yr.   Past Medical History:  Diagnosis Date  . Abnormal Pap smear of cervix   . Asthma   . Chlamydia 01/2016  . Depression   . Hypercholesterolemia   . Hypertension   . Ovarian cyst   . Overactive bladder     Past Surgical History:  Procedure Laterality Date  . DILATION AND CURETTAGE OF UTERUS  1995  . TUBAL LIGATION      Family History  Problem Relation Age of Onset  . Arthritis Mother   . Asthma Mother   . Cancer Mother        cervical  . Hyperlipidemia Father   .  Hypertension Father   . Heart attack Father        old/healed  . Colon cancer Father 56  . Arthritis Maternal Grandmother   . Arthritis Paternal Grandmother   . Cancer Paternal Grandmother        unknown  . Brain cancer Paternal Aunt 31  . Brain cancer Maternal Grandfather 66  . Pancreatic cancer Paternal Uncle 71    Social History   Socioeconomic History  . Marital status: Single    Spouse name: Not on file  . Number of children: 2  . Years of education: Not on file  . Highest education level: Not on file  Occupational History  . Not on file  Tobacco Use  . Smoking status: Current Some Day Smoker    Types: Cigarettes  . Smokeless tobacco: Never Used  Substance and Sexual Activity  . Alcohol use: Yes    Comment: 1-4/wk  . Drug use: No  . Sexual activity: Yes    Birth control/protection: Surgical    Comment: tubal ligation  Other Topics Concern  . Not on file  Social History Narrative  . Not on file   Social Determinants of Health   Financial Resource Strain:   . Difficulty of Paying Living Expenses: Not on file  Food Insecurity:   . Worried About [employer] of  Food in the Last Year: Not on file  . Ran Out of Food in the Last Year: Not on file  Transportation Needs:   . Lack of Transportation (Medical): Not on file  . Lack of Transportation (Non-Medical): Not on file  Physical Activity:   . Days of Exercise per Week: Not on file  . Minutes of Exercise per Session: Not on file  Stress:   . Feeling of Stress : Not on file  Social Connections:   . Frequency of Communication with Friends and Family: Not on file  . Frequency of Social Gatherings with Friends and Family: Not on file  . Attends Religious Services: Not on file  . Active Member of Clubs or Organizations: Not on file  . Attends Archivist Meetings: Not on file  . Marital Status: Not on file  Intimate Partner Violence:   . Fear of Current or Ex-Partner: Not on file  . Emotionally Abused:  Not on file  . Physically Abused: Not on file  . Sexually Abused: Not on file    Outpatient Medications Prior to Visit  Medication Sig Dispense Refill  . Multiple Vitamin (MULTI-VITAMINS) TABS Take by mouth.     No facility-administered medications prior to visit.      ROS:  Review of Systems  Constitutional: Negative for fatigue, fever and unexpected weight change.  Respiratory: Negative for cough, shortness of breath and wheezing.   Cardiovascular: Negative for chest pain, palpitations and leg swelling.  Gastrointestinal: Positive for blood in stool. Negative for constipation, diarrhea, nausea and vomiting.  Endocrine: Negative for cold intolerance, heat intolerance and polyuria.  Genitourinary: Negative for dyspareunia, dysuria, flank pain, frequency, genital sores, hematuria, menstrual problem, pelvic pain, urgency, vaginal bleeding, vaginal discharge and vaginal pain.  Musculoskeletal: Positive for arthralgias. Negative for back pain, joint swelling and myalgias.  Skin: Negative for rash.  Neurological: Negative for dizziness, syncope, light-headedness, numbness and headaches.  Hematological: Negative for adenopathy.  Psychiatric/Behavioral: Positive for agitation and dysphoric mood. Negative for confusion, decreased concentration, sleep disturbance and suicidal ideas. The patient is not nervous/anxious.   BREAST: No symptoms   Objective: BP 120/80   Ht 5\' 4"  (1.626 m)   Wt 191 lb (86.6 kg)   LMP 03/23/2019 (Exact Date)   BMI 32.79 kg/m    Physical Exam Constitutional:      Appearance: She is well-developed.  Genitourinary:     Vulva, vagina, cervix, uterus, right adnexa and left adnexa normal.     No vulval lesion or tenderness noted.     No vaginal discharge, erythema or tenderness.     No cervical polyp.     Uterus is not enlarged or tender.     No right or left adnexal mass present.     Right adnexa not tender.     Left adnexa not tender.  Rectum:      Guaiac result negative.     External hemorrhoid present.  Neck:     Thyroid: No thyromegaly.  Cardiovascular:     Rate and Rhythm: Normal rate and regular rhythm.     Heart sounds: Normal heart sounds. No murmur.  Pulmonary:     Effort: Pulmonary effort is normal.     Breath sounds: Normal breath sounds.  Chest:     Breasts:        Right: No mass, nipple discharge, skin change or tenderness.        Left: No mass, nipple discharge, skin change or tenderness.  Abdominal:  Palpations: Abdomen is soft.     Tenderness: There is no abdominal tenderness. There is no guarding.  Musculoskeletal:        General: Normal range of motion.     Cervical back: Normal range of motion.  Neurological:     General: No focal deficit present.     Mental Status: She is alert and oriented to person, place, and time.     Cranial Nerves: No cranial nerve deficit.  Skin:    General: Skin is warm and dry.  Psychiatric:        Mood and Affect: Mood normal.        Behavior: Behavior normal.        Thought Content: Thought content normal.        Judgment: Judgment normal.  Vitals reviewed.    RESULTS:  Results for orders placed or performed in visit on 04/10/19 (from the past 24 hour(s))  POCT Occult Blood Stool     Status: Normal   Collection Time: 04/10/19  4:27 PM  Result Value Ref Range   Fecal Occult Blood, POC Negative Negative   Card #1 Date     Card #2 Fecal Occult Blod, POC     Card #2 Date     Card #3 Fecal Occult Blood, POC     Card #3 Date      Assessment/Plan: Encounter for annual routine gynecological examination  Screening for STD (sexually transmitted disease) - Plan: HIV Antibody (routine testing w rflx), RPR, HSV 2 antibody, IgG, Hepatitis C antibody, Cervicovaginal ancillary only  Encounter for screening mammogram for malignant neoplasm of breast - Plan: MM 3D SCREEN BREAST BILATERAL; pt to sched mammo  Rectal bleeding - Plan: POCT Occult Blood Stool; Neg FOBT today. Sx  improved with constipation improvement. Declines scr colonoscopy due to sx/FH colon cancer.   Family history of colon cancer--MyRisk testing discussed and pt declines.           GYN counsel breast self exam, mammography screening, adequate intake of calcium and vitamin D, diet and exercise     F/U  Return in about 1 year (around 04/09/2020).  Hailee Hollick B. Blakelyn Dinges, PA-C 04/10/2019 4:27 PM

## 2019-04-10 NOTE — Patient Instructions (Addendum)
I value your feedback and entrusting us with your care. If you get a [facility] patient survey, I would appreciate you taking the time to let us know about your experience today. Thank you! ° °As of January 31, 2019, your lab results will be released to your MyChart immediately, before I even have a chance to see them. Please give me time to review them and contact you if there are any abnormalities. Thank you for your patience.  ° °[facility]: [phone] ° ° ° °

## 2019-04-11 LAB — HIV ANTIBODY (ROUTINE TESTING W REFLEX): HIV Screen 4th Generation wRfx: NONREACTIVE

## 2019-04-11 LAB — RPR: RPR Ser Ql: NONREACTIVE

## 2019-04-11 LAB — HEPATITIS C ANTIBODY: Hep C Virus Ab: <0.1 s/co ratio (ref 0.0–0.9)

## 2019-04-11 LAB — HSV 2 ANTIBODY, IGG: HSV 2 IgG, Type Spec: 9.18 index — ABNORMAL HIGH (ref 0.00–0.90)

## 2019-04-12 LAB — CERVICOVAGINAL ANCILLARY ONLY
Chlamydia: NEGATIVE
Comment: NEGATIVE
Comment: NORMAL
Neisseria Gonorrhea: NEGATIVE

## 2019-04-15 ENCOUNTER — Encounter: Payer: Self-pay | Admitting: Adult Health

## 2019-04-15 ENCOUNTER — Ambulatory Visit (INDEPENDENT_AMBULATORY_CARE_PROVIDER_SITE_OTHER): Payer: BLUE CROSS/BLUE SHIELD | Admitting: Adult Health

## 2019-04-15 ENCOUNTER — Other Ambulatory Visit: Payer: Self-pay

## 2019-04-15 VITALS — BP 110/80 | HR 87 | Temp 96.6°F | Resp 16 | Ht 64.0 in | Wt 190.8 lb

## 2019-04-15 DIAGNOSIS — E785 Hyperlipidemia, unspecified: Secondary | ICD-10-CM | POA: Diagnosis not present

## 2019-04-15 DIAGNOSIS — R635 Abnormal weight gain: Secondary | ICD-10-CM | POA: Insufficient documentation

## 2019-04-15 DIAGNOSIS — E559 Vitamin D deficiency, unspecified: Secondary | ICD-10-CM | POA: Insufficient documentation

## 2019-04-15 DIAGNOSIS — Z6832 Body mass index (BMI) 32.0-32.9, adult: Secondary | ICD-10-CM | POA: Insufficient documentation

## 2019-04-15 DIAGNOSIS — Z0000 Encounter for general adult medical examination without abnormal findings: Secondary | ICD-10-CM | POA: Diagnosis not present

## 2019-04-15 DIAGNOSIS — R5383 Other fatigue: Secondary | ICD-10-CM | POA: Diagnosis not present

## 2019-04-15 DIAGNOSIS — M2550 Pain in unspecified joint: Secondary | ICD-10-CM | POA: Insufficient documentation

## 2019-04-15 DIAGNOSIS — R768 Other specified abnormal immunological findings in serum: Secondary | ICD-10-CM | POA: Insufficient documentation

## 2019-04-15 LAB — POCT URINALYSIS DIPSTICK
Bilirubin, UA: NEGATIVE
Blood, UA: NEGATIVE
Glucose, UA: NEGATIVE
Ketones, UA: NEGATIVE
Leukocytes, UA: NEGATIVE
Nitrite, UA: NEGATIVE
Protein, UA: NEGATIVE
Spec Grav, UA: 1.015 (ref 1.010–1.025)
Urobilinogen, UA: 0.2 E.U./dL
pH, UA: 5 (ref 5.0–8.0)

## 2019-04-15 MED ORDER — VALACYCLOVIR HCL 1 G PO TABS: 1000.0000 mg | ORAL_TABLET | Freq: Two times a day (BID) | ORAL | 0 refills | Status: DC

## 2019-04-15 NOTE — Patient Instructions (Addendum)
DASH Eating Plan DASH stands for "Dietary Approaches to Stop Hypertension." The DASH eating plan is a healthy eating plan that has been shown to reduce high blood pressure (hypertension). It may also reduce your risk for type 2 diabetes, heart disease, and stroke. The DASH eating plan may also help with weight loss. What are tips for following this plan?  General guidelines  Avoid eating more than 2,300 mg (milligrams) of salt (sodium) a day. If you have hypertension, you may need to reduce your sodium intake to 1,500 mg a day.  Limit alcohol intake to no more than 1 drink a day for nonpregnant women and 2 drinks a day for men. One drink equals 12 oz of beer, 5 oz of wine, or 1 oz of hard liquor.  Work with your health care provider to maintain a healthy body weight or to lose weight. Ask what an ideal weight is for you.  Get at least 30 minutes of exercise that causes your heart to beat faster (aerobic exercise) most days of the week. Activities may include walking, swimming, or biking.  Work with your health care provider or diet and nutrition specialist (dietitian) to adjust your eating plan to your individual calorie needs. Reading food labels   Check food labels for the amount of sodium per serving. Choose foods with less than 5 percent of the Daily Value of sodium. Generally, foods with less than 300 mg of sodium per serving fit into this eating plan.  To find whole grains, look for the word "whole" as the first word in the ingredient list. Shopping  Buy products labeled as "low-sodium" or "no salt added."  Buy fresh foods. Avoid canned foods and premade or frozen meals. Cooking  Avoid adding salt when cooking. Use salt-free seasonings or herbs instead of table salt or sea salt. Check with your health care provider or pharmacist before using salt substitutes.  Do not fry foods. Cook foods using healthy methods such as baking, boiling, grilling, and broiling instead.  Cook with  heart-healthy oils, such as olive, canola, soybean, or sunflower oil. Meal planning  Eat a balanced diet that includes: ? 5 or more servings of fruits and vegetables each day. At each meal, try to fill half of your plate with fruits and vegetables. ? Up to 6-8 servings of whole grains each day. ? Less than 6 oz of lean meat, poultry, or fish each day. A 3-oz serving of meat is about the same size as a deck of cards. One egg equals 1 oz. ? 2 servings of low-fat dairy each day. ? A serving of nuts, seeds, or beans 5 times each week. ? Heart-healthy fats. Healthy fats called Omega-3 fatty acids are found in foods such as flaxseeds and coldwater fish, like sardines, salmon, and mackerel.  Limit how much you eat of the following: ? Canned or prepackaged foods. ? Food that is high in trans fat, such as fried foods. ? Food that is high in saturated fat, such as fatty meat. ? Sweets, desserts, sugary drinks, and other foods with added sugar. ? Full-fat dairy products.  Do not salt foods before eating.  Try to eat at least 2 vegetarian meals each week.  Eat more home-cooked food and less restaurant, buffet, and fast food.  When eating at a restaurant, ask that your food be prepared with less salt or no salt, if possible. What foods are recommended? The items listed may not be a complete list. Talk with your dietitian about   what dietary choices are best for you. Grains Whole-grain or whole-wheat bread. Whole-grain or whole-wheat pasta. Brown rice. Oatmeal. Quinoa. Bulgur. Whole-grain and low-sodium cereals. Pita bread. Low-fat, low-sodium crackers. Whole-wheat flour tortillas. Vegetables Fresh or frozen vegetables (raw, steamed, roasted, or grilled). Low-sodium or reduced-sodium tomato and vegetable juice. Low-sodium or reduced-sodium tomato sauce and tomato paste. Low-sodium or reduced-sodium canned vegetables. Fruits All fresh, dried, or frozen fruit. Canned fruit in natural juice (without  added sugar). Meat and other protein foods Skinless chicken or turkey. Ground chicken or turkey. Pork with fat trimmed off. Fish and seafood. Egg whites. Dried beans, peas, or lentils. Unsalted nuts, nut butters, and seeds. Unsalted canned beans. Lean cuts of beef with fat trimmed off. Low-sodium, lean deli meat. Dairy Low-fat (1%) or fat-free (skim) milk. Fat-free, low-fat, or reduced-fat cheeses. Nonfat, low-sodium ricotta or cottage cheese. Low-fat or nonfat yogurt. Low-fat, low-sodium cheese. Fats and oils Soft margarine without trans fats. Vegetable oil. Low-fat, reduced-fat, or light mayonnaise and salad dressings (reduced-sodium). Canola, safflower, olive, soybean, and sunflower oils. Avocado. Seasoning and other foods Herbs. Spices. Seasoning mixes without salt. Unsalted popcorn and pretzels. Fat-free sweets. What foods are not recommended? The items listed may not be a complete list. Talk with your dietitian about what dietary choices are best for you. Grains Baked goods made with fat, such as croissants, muffins, or some breads. Dry pasta or rice meal packs. Vegetables Creamed or fried vegetables. Vegetables in a cheese sauce. Regular canned vegetables (not low-sodium or reduced-sodium). Regular canned tomato sauce and paste (not low-sodium or reduced-sodium). Regular tomato and vegetable juice (not low-sodium or reduced-sodium). Pickles. Olives. Fruits Canned fruit in a light or heavy syrup. Fried fruit. Fruit in cream or butter sauce. Meat and other protein foods Fatty cuts of meat. Ribs. Fried meat. Bacon. Sausage. Bologna and other processed lunch meats. Salami. Fatback. Hotdogs. Bratwurst. Salted nuts and seeds. Canned beans with added salt. Canned or smoked fish. Whole eggs or egg yolks. Chicken or turkey with skin. Dairy Whole or 2% milk, cream, and half-and-half. Whole or full-fat cream cheese. Whole-fat or sweetened yogurt. Full-fat cheese. Nondairy creamers. Whipped toppings.  Processed cheese and cheese spreads. Fats and oils Butter. Stick margarine. Lard. Shortening. Ghee. Bacon fat. Tropical oils, such as coconut, palm kernel, or palm oil. Seasoning and other foods Salted popcorn and pretzels. Onion salt, garlic salt, seasoned salt, table salt, and sea salt. Worcestershire sauce. Tartar sauce. Barbecue sauce. Teriyaki sauce. Soy sauce, including reduced-sodium. Steak sauce. Canned and packaged gravies. Fish sauce. Oyster sauce. Cocktail sauce. Horseradish that you find on the shelf. Ketchup. Mustard. Meat flavorings and tenderizers. Bouillon cubes. Hot sauce and Tabasco sauce. Premade or packaged marinades. Premade or packaged taco seasonings. Relishes. Regular salad dressings. Where to find more information:  National Heart, Lung, and Blood Institute: www.nhlbi.nih.gov  American Heart Association: www.heart.org Summary  The DASH eating plan is a healthy eating plan that has been shown to reduce high blood pressure (hypertension). It may also reduce your risk for type 2 diabetes, heart disease, and stroke.  With the DASH eating plan, you should limit salt (sodium) intake to 2,300 mg a day. If you have hypertension, you may need to reduce your sodium intake to 1,500 mg a day.  When on the DASH eating plan, aim to eat more fresh fruits and vegetables, whole grains, lean proteins, low-fat dairy, and heart-healthy fats.  Work with your health care provider or diet and nutrition specialist (dietitian) to adjust your eating plan to your   individual calorie needs. This information is not intended to replace advice given to you by your health care provider. Make sure you discuss any questions you have with your health care provider. Document Revised: 01/20/2017 Document Reviewed: 02/01/2016 Elsevier Patient Education  2020 [employer] Calorie Counting for [employer] Calories are units of energy. Your body needs a certain amount of calories from food to keep you going  throughout the day. When you eat more calories than your body needs, your body stores the extra calories as fat. When you eat fewer calories than your body needs, your body burns fat to get the energy it needs. Calorie counting means keeping track of how many calories you eat and drink each day. Calorie counting can be helpful if you need to lose weight. If you make sure to eat fewer calories than your body needs, you should lose weight. Ask your health care provider what a healthy weight is for you. For calorie counting to work, you will need to eat the right number of calories in a day in order to lose a healthy amount of weight per week. A dietitian can help you determine how many calories you need in a day and will give you suggestions on how to reach your calorie goal.  A healthy amount of weight to lose per week is usually 1-2 lb (0.5-0.9 kg). This usually means that your daily calorie intake should be reduced by 500-750 calories.  Eating 1,200 - 1,500 calories per day can help most women lose weight.  Eating 1,500 - 1,800 calories per day can help most men lose weight. What is my plan? My goal is to have __________ calories per day. If I have this many calories per day, I should lose around __________ pounds per week. What do I need to know about calorie counting? In order to meet your daily calorie goal, you will need to:  Find out how many calories are in each food you would like to eat. Try to do this before you eat.  Decide how much of the food you plan to eat.  Write down what you ate and how many calories it had. Doing this is called keeping a food log. To successfully lose weight, it is important to balance calorie counting with a healthy lifestyle that includes regular activity. Aim for 150 minutes of moderate exercise (such as walking) or 75 minutes of vigorous exercise (such as running) each week. Where do I find calorie information?  The number of calories in a food can be  found on a Nutrition Facts label. If a food does not have a Nutrition Facts label, try to look up the calories online or ask your dietitian for help. Remember that calories are listed per serving. If you choose to have more than one serving of a food, you will have to multiply the calories per serving by the amount of servings you plan to eat. For example, the label on a package of bread might say that a serving size is 1 slice and that there are 90 calories in a serving. If you eat 1 slice, you will have eaten 90 calories. If you eat 2 slices, you will have eaten 180 calories. How do I keep a food log? Immediately after each meal, record the following information in your food log:  What you ate. Don't forget to include toppings, sauces, and other extras on the food.  How much you ate. This can be measured in cups, ounces, or number  of items.  How many calories each food and drink had.  The total number of calories in the meal. Keep your food log near you, such as in a small notebook in your pocket, or use a mobile app or website. Some programs will calculate calories for you and show you how many calories you have left for the day to meet your goal. What are some calorie counting tips?   Use your calories on foods and drinks that will fill you up and not leave you hungry: ? Some examples of foods that fill you up are nuts and nut butters, vegetables, lean proteins, and high-fiber foods like whole grains. High-fiber foods are foods with more than 5 g fiber per serving. ? Drinks such as sodas, specialty coffee drinks, alcohol, and juices have a lot of calories, yet do not fill you up.  Eat nutritious foods and avoid empty calories. Empty calories are calories you get from foods or beverages that do not have many vitamins or protein, such as candy, sweets, and soda. It is better to have a nutritious high-calorie food (such as an avocado) than a food with few nutrients (such as a bag of  chips).  Know how many calories are in the foods you eat most often. This will help you calculate calorie counts faster.  Pay attention to calories in drinks. Low-calorie drinks include water and unsweetened drinks.  Pay attention to nutrition labels for "low fat" or "fat free" foods. These foods sometimes have the same amount of calories or more calories than the full fat versions. They also often have added sugar, starch, or salt, to make up for flavor that was removed with the fat.  Find a way of tracking calories that works for you. Get creative. Try different apps or programs if writing down calories does not work for you. What are some portion control tips?  Know how many calories are in a serving. This will help you know how many servings of a certain food you can have.  Use a measuring cup to measure serving sizes. You could also try weighing out portions on a kitchen scale. With time, you will be able to estimate serving sizes for some foods.  Take some time to put servings of different foods on your favorite plates, bowls, and cups so you know what a serving looks like.  Try not to eat straight from a bag or box. Doing this can lead to overeating. Put the amount you would like to eat in a cup or on a plate to make sure you are eating the right portion.  Use smaller plates, glasses, and bowls to prevent overeating.  Try not to multitask (for example, watch TV or use your computer) while eating. If it is time to eat, sit down at a table and enjoy your food. This will help you to know when you are full. It will also help you to be aware of what you are eating and how much you are eating. What are tips for following this plan? Reading food labels  Check the calorie count compared to the serving size. The serving size may be smaller than what you are used to eating.  Check the source of the calories. Make sure the food you are eating is high in vitamins and protein and low in  saturated and trans fats. Shopping  Read nutrition labels while you shop. This will help you make healthy decisions before you decide to purchase your food.  Make  a grocery list and stick to it. Cooking  Try to cook your favorite foods in a healthier way. For example, try baking instead of frying.  Use low-fat dairy products. Meal planning  Use more fruits and vegetables. Half of your plate should be fruits and vegetables.  Include lean proteins like poultry and fish. How do I count calories when eating out?  Ask for smaller portion sizes.  Consider sharing an entree and sides instead of getting your own entree.  If you get your own entree, eat only half. Ask for a box at the beginning of your meal and put the rest of your entree in it so you are not tempted to eat it.  If calories are listed on the menu, choose the lower calorie options.  Choose dishes that include vegetables, fruits, whole grains, low-fat dairy products, and lean protein.  Choose items that are boiled, broiled, grilled, or steamed. Stay away from items that are buttered, battered, fried, or served with cream sauce. Items labeled "crispy" are usually fried, unless stated otherwise.  Choose water, low-fat milk, unsweetened iced tea, or other drinks without added sugar. If you want an alcoholic beverage, choose a lower calorie option such as a glass of wine or light beer.  Ask for dressings, sauces, and syrups on the side. These are usually high in calories, so you should limit the amount you eat.  If you want a salad, choose a garden salad and ask for grilled meats. Avoid extra toppings like bacon, cheese, or fried items. Ask for the dressing on the side, or ask for olive oil and vinegar or lemon to use as dressing.  Estimate how many servings of a food you are given. For example, a serving of cooked rice is  cup or about the size of half a baseball. Knowing serving sizes will help you be aware of how much food  you are eating at restaurants. The list below tells you how big or small some common portion sizes are based on everyday objects: ? 1 oz--4 stacked dice. ? 3 oz--1 deck of cards. ? 1 tsp--1 die. ? 1 Tbsp-- a ping-pong ball. ? 2 Tbsp--1 ping-pong ball. ?  cup-- baseball. ? 1 cup--1 baseball. Summary  Calorie counting means keeping track of how many calories you eat and drink each day. If you eat fewer calories than your body needs, you should lose weight.  A healthy amount of weight to lose per week is usually 1-2 lb (0.5-0.9 kg). This usually means reducing your daily calorie intake by 500-750 calories.  The number of calories in a food can be found on a Nutrition Facts label. If a food does not have a Nutrition Facts label, try to look up the calories online or ask your dietitian for help.  Use your calories on foods and drinks that will fill you up, and not on foods and drinks that will leave you hungry.  Use smaller plates, glasses, and bowls to prevent overeating. This information is not intended to replace advice given to you by your health care provider. Make sure you discuss any questions you have with your health care provider. Document Revised: 10/27/2017 Document Reviewed: 01/08/2016 Elsevier Patient Education  2020 [employer] Health Maintenance, Female Adopting a healthy lifestyle and getting preventive care are important in promoting health and wellness. Ask your health care provider about:  The right schedule for you to have regular tests and exams.  Things you can do on your own to prevent  diseases and keep yourself healthy. What should I know about diet, weight, and exercise? Eat a healthy diet   Eat a diet that includes plenty of vegetables, fruits, low-fat dairy products, and lean protein.  Do not eat a lot of foods that are high in solid fats, added sugars, or sodium. Maintain a healthy weight Body mass index (BMI) is used to identify weight problems. It  estimates body fat based on height and weight. Your health care provider can help determine your BMI and help you achieve or maintain a healthy weight. Get regular exercise Get regular exercise. This is one of the most important things you can do for your health. Most adults should:  Exercise for at least 150 minutes each week. The exercise should increase your heart rate and make you sweat (moderate-intensity exercise).  Do strengthening exercises at least twice a week. This is in addition to the moderate-intensity exercise.  Spend less time sitting. Even light physical activity can be beneficial. Watch cholesterol and blood lipids Have your blood tested for lipids and cholesterol at 45 years of age, then have this test every 5 years. Have your cholesterol levels checked more often if:  Your lipid or cholesterol levels are high.  You are older than 45 years of age.  You are at high risk for heart disease. What should I know about cancer screening? Depending on your health history and family history, you may need to have cancer screening at various ages. This may include screening for:  Breast cancer.  Cervical cancer.  Colorectal cancer.  Skin cancer.  Lung cancer. What should I know about heart disease, diabetes, and high blood pressure? Blood pressure and heart disease  High blood pressure causes heart disease and increases the risk of stroke. This is more likely to develop in people who have high blood pressure readings, are of African descent, or are overweight.  Have your blood pressure checked: ? Every 3-5 years if you are 65-9 years of age. ? Every year if you are 36 years old or older. Diabetes Have regular diabetes screenings. This checks your fasting blood sugar level. Have the screening done:  Once every three years after age 24 if you are at a normal weight and have a low risk for diabetes.  More often and at a younger age if you are overweight or have a high  risk for diabetes. What should I know about preventing infection? Hepatitis B If you have a higher risk for hepatitis B, you should be screened for this virus. Talk with your health care provider to find out if you are at risk for hepatitis B infection. Hepatitis C Testing is recommended for:  Everyone born from [birth year] through 1966.  Anyone with known risk factors for hepatitis C. Sexually transmitted infections (STIs)  Get screened for STIs, including gonorrhea and chlamydia, if: ? You are sexually active and are younger than 45 years of age. ? You are older than 45 years of age and your health care provider tells you that you are at risk for this type of infection. ? Your sexual activity has changed since you were last screened, and you are at increased risk for chlamydia or gonorrhea. Ask your health care provider if you are at risk.  Ask your health care provider about whether you are at high risk for HIV. Your health care provider may recommend a prescription medicine to help prevent HIV infection. If you choose to take medicine to prevent HIV, you should first  get tested for HIV. You should then be tested every 3 months for as long as you are taking the medicine. Pregnancy  If you are about to stop having your period (premenopausal) and you may become pregnant, seek counseling before you get pregnant.  Take 400 to 800 micrograms (mcg) of folic acid every day if you become pregnant.  Ask for birth control (contraception) if you want to prevent pregnancy. Osteoporosis and menopause Osteoporosis is a disease in which the bones lose minerals and strength with aging. This can result in bone fractures. If you are 44 years old or older, or if you are at risk for osteoporosis and fractures, ask your health care provider if you should:  Be screened for bone loss.  Take a calcium or vitamin D supplement to lower your risk of fractures.  Be given hormone replacement therapy (HRT) to treat  symptoms of menopause. Follow these instructions at home: Lifestyle  Do not use any products that contain nicotine or tobacco, such as cigarettes, e-cigarettes, and chewing tobacco. If you need help quitting, ask your health care provider.  Do not use street drugs.  Do not share needles.  Ask your health care provider for help if you need support or information about quitting drugs. Alcohol use  Do not drink alcohol if: ? Your health care provider tells you not to drink. ? You are pregnant, may be pregnant, or are planning to become pregnant.  If you drink alcohol: ? Limit how much you use to 0-1 drink a day. ? Limit intake if you are breastfeeding.  Be aware of how much alcohol is in your drink. In the [place] one drink equals one 12 oz bottle of beer (355 mL), one 5 oz glass of wine (148 mL), or one 1 oz glass of hard liquor (44 mL). General instructions  Schedule regular health, dental, and eye exams.  Stay current with your vaccines.  Tell your health care provider if: ? You often feel depressed. ? You have ever been abused or do not feel safe at home. Summary  Adopting a healthy lifestyle and getting preventive care are important in promoting health and wellness.  Follow your health care provider's instructions about healthy diet, exercising, and getting tested or screened for diseases.  Follow your health care provider's instructions on monitoring your cholesterol and blood pressure. This information is not intended to replace advice given to you by your health care provider. Make sure you discuss any questions you have with your health care provider. Document Revised: 01/31/2018 Document Reviewed: 01/31/2018 Elsevier Patient Education  2020 [employer] Inc. Genital Herpes Genital herpes is a common sexually transmitted infection (STI) that is caused by a virus. The virus spreads from person to person through sexual contact. Infection can cause itching, blisters, and  sores around the genitals or rectum. Symptoms may last several days and then go away This is called an outbreak. However, the virus remains in your body, so you may have more outbreaks in the future. The time between outbreaks varies and can be months or years. Genital herpes affects men and women. It is particularly concerning for pregnant women because the virus can be passed to the baby during delivery and can cause serious problems. Genital herpes is also a concern for people who have a weak disease-fighting (immune) system. What are the causes? This condition is caused by the herpes simplex virus (HSV) type 1 or type 2. The virus may spread through:  Sexual contact with an infected  person, including vaginal, anal, and oral sex.  Contact with fluid from a herpes sore.  The skin. This means that you can get herpes from an infected partner even if he or she does not have a visible sore or does not know that he or she is infected. What increases the risk? You are more likely to develop this condition if:  You have sex with many partners.  You do not use latex condoms during sex. What are the signs or symptoms? Most people do not have symptoms (asymptomatic) or have mild symptoms that may be mistaken for other skin problems. Symptoms may include:  Small red bumps near the genitals, rectum, or mouth. These bumps turn into blisters and then turn into sores.  Flu-like symptoms, including: ? Fever. ? Body aches. ? Swollen lymph nodes. ? Headache.  Painful urination.  Pain and itching in the genital area or rectal area.  Vaginal discharge.  Tingling or shooting pain in the legs and buttocks. Generally, symptoms are more severe and last longer during the first (primary) outbreak. Flu-like symptoms are also more common during the primary outbreak. How is this diagnosed? Genital herpes may be diagnosed based on:  A physical exam.  Your medical history.  Blood tests.  A test of a  fluid sample (culture) from an open sore. How is this treated? There is no cure for this condition, but treatment with antiviral medicines that are taken by mouth (orally) can do the following:  Speed up healing and relieve symptoms.  Help to reduce the spread of the virus to sexual partners.  Limit the chance of future outbreaks, or make future outbreaks shorter.  Lessen symptoms of future outbreaks. Your health care provider may also recommend pain relief medicines, such as aspirin or ibuprofen. Follow these instructions at home: Sexual activity  Do not have sexual contact during active outbreaks.  Practice safe sex. Latex condoms and female condoms may help prevent the spread of the herpes virus. General instructions  Keep the affected areas dry and clean.  Take over-the-counter and prescription medicines only as told by your health care provider.  Avoid rubbing or touching blisters and sores. If you do touch blisters or sores: ? Wash your hands thoroughly with soap and water. ? Do not touch your eyes afterward.  To help relieve pain or itching, you may take the following actions as directed by your health care provider: ? Apply a cold, wet cloth (cold compress) to affected areas 4-6 times a day. ? Apply a substance that protects your skin and reduces bleeding (astringent). ? Apply a gel that helps relieve pain around sores (lidocaine gel). ? Take a warm, shallow bath that cleans the genital area (sitz bath).  Keep all follow-up visits as told by your health care provider. This is important. How is this prevented?  Use condoms. Although anyone can get genital herpes during sexual contact, even with the use of a condom, a condom can provide some protection.  Avoid having multiple sexual partners.  Talk with your sexual partner about any symptoms either of you may have. Also, talk with your partner about any history of STIs.  Get tested for STIs before you have sex. Ask  your partner to do the same.  Do not have sexual contact if you have symptoms of genital herpes. Contact a health care provider if:  Your symptoms are not improving with medicine.  Your symptoms return.  You have new symptoms.  You have a fever.  You  have abdominal pain.  You have redness, swelling, or pain in your eye.  You notice new sores on other parts of your body.  You are a woman and experience bleeding between menstrual periods.  You have had herpes and you become pregnant or plan to become pregnant. Summary  Genital herpes is a common sexually transmitted infection (STI) that is caused by the herpes simplex virus (HSV) type 1 or type 2.  These viruses are most often spread through sexual contact with an infected person.  You are more likely to develop this condition if you have sex with many partners or you have unprotected sex.  Most people do not have symptoms (asymptomatic) or have mild symptoms that may be mistaken for other skin problems. Symptoms occur as outbreaks that may happen months or years apart.  There is no cure for this condition, but treatment with oral antiviral medicines can reduce symptoms, reduce the chance of spreading the virus to a partner, prevent future outbreaks, or shorten future outbreaks. This information is not intended to replace advice given to you by your health care provider. Make sure you discuss any questions you have with your health care provider. Document Revised: 08/14/2017 Document Reviewed: 01/08/2016 Elsevier Patient Education  2020 [employer] Valacyclovir caplets What is this medicine? VALACYCLOVIR (val ay SYE kloe veer) is an antiviral medicine. It is used to treat or prevent infections caused by certain kinds of viruses. Examples of these infections include herpes and shingles. This medicine will not cure herpes. This medicine may be used for other purposes; ask your health care provider or pharmacist if you have  questions. COMMON BRAND NAME(S): Valtrex What should I tell my health care provider before I take this medicine? They need to know if you have any of these conditions:  acquired immunodeficiency syndrome (AIDS)  any other condition that may weaken the immune system  bone marrow or kidney transplant  kidney disease  an unusual or allergic reaction to valacyclovir, acyclovir, ganciclovir, valganciclovir, other medicines, foods, dyes, or preservatives  pregnant or trying to get pregnant  breast-feeding How should I use this medicine? Take this medicine by mouth with a glass of water. Follow the directions on the prescription label. You can take this medicine with or without food. Take your doses at regular intervals. Do not take your medicine more often than directed. Finish the full course prescribed by your doctor or health care professional even if you think your condition is better. Do not stop taking except on the advice of your doctor or health care professional. Talk to your pediatrician regarding the use of this medicine in children. While this drug may be prescribed for children as young as 2 years for selected conditions, precautions do apply. Overdosage: If you think you have taken too much of this medicine contact a poison control center or emergency room at once. NOTE: This medicine is only for you. Do not share this medicine with others. What if I miss a dose? If you miss a dose, take it as soon as you can. If it is almost time for your next dose, take only that dose. Do not take double or extra doses. What may interact with this medicine? Do not take this medicine with any of the following medications:  cidofovir This medicine may also interact with the following medications:  adefovir  amphotericin B  certain antibiotics like amikacin, gentamicin, tobramycin,  vancomycin  cimetidine  cisplatin  colistin  cyclosporine  foscarnet  lithium  methotrexate  probenecid  tacrolimus This list may not describe all possible interactions. Give your health care provider a list of all the medicines, herbs, non-prescription drugs, or dietary supplements you use. Also tell them if you smoke, drink alcohol, or use illegal drugs. Some items may interact with your medicine. What should I watch for while using this medicine? Tell your doctor or health care professional if your symptoms do not start to get better after 1 week. This medicine works best when taken early in the course of an infection, within the first 72 hours. Begin treatment as soon as possible after the first signs of infection like tingling, itching, or pain in the affected area. It is possible that genital herpes may still be spread even when you are not having symptoms. Always use safer sex practices like condoms made of latex or polyurethane whenever you have sexual contact. You should stay well hydrated while taking this medicine. Drink plenty of fluids. What side effects may I notice from receiving this medicine? Side effects that you should report to your doctor or health care professional as soon as possible:  allergic reactions like skin rash, itching or hives, swelling of the face, lips, or tongue  aggressive behavior  confusion  hallucinations  problems with balance, talking, walking  stomach pain  tremor  trouble passing urine or change in the amount of urine Side effects that usually do not require medical attention (report to your doctor or health care professional if they continue or are bothersome):  dizziness  headache  nausea, vomiting This list may not describe all possible side effects. Call your doctor for medical advice about side effects. You may report side effects to FDA at 1-800-FDA-1088. Where should I keep my medicine? Keep out of the reach of  children. Store at room temperature between 15 and 25 degrees C (59 and 77 degrees F). Keep container tightly closed. Throw away any unused medicine after the expiration date. NOTE: This sheet is a summary. It may not cover all possible information. If you have questions about this medicine, talk to your doctor, pharmacist, or health care provider.  2020 Elsevier/Gold Standard (2018-03-06 12:22:33)   Calorie Counting for Weight Loss Calories are units of energy. Your body needs a certain amount of calories from food to keep you going throughout the day. When you eat more calories than your body needs, your body stores the extra calories as fat. When you eat fewer calories than your body needs, your body burns fat to get the energy it needs. Calorie counting means keeping track of how many calories you eat and drink each day. Calorie counting can be helpful if you need to lose weight. If you make sure to eat fewer calories than your body needs, you should lose weight. Ask your health care provider what a healthy weight is for you. For calorie counting to work, you will need to eat the right number of calories in a day in order to lose a healthy amount of weight per week. A dietitian can help you determine how many calories you need in a day and will give you suggestions on how to reach your calorie goal.  A healthy amount of weight to lose per week is usually 1-2 lb (0.5-0.9 kg). This usually means that your daily calorie intake should be reduced by 500-750 calories.  Eating 1,200 - 1,500 calories per day can help most women lose weight.  Eating 1,500 - 1,800 calories per day can help most  men lose weight. What is my plan? My goal is to have __________ calories per day. If I have this many calories per day, I should lose around __________ pounds per week. What do I need to know about calorie counting? In order to meet your daily calorie goal, you will need to:  Find out how many calories are in  each food you would like to eat. Try to do this before you eat.  Decide how much of the food you plan to eat.  Write down what you ate and how many calories it had. Doing this is called keeping a food log. To successfully lose weight, it is important to balance calorie counting with a healthy lifestyle that includes regular activity. Aim for 150 minutes of moderate exercise (such as walking) or 75 minutes of vigorous exercise (such as running) each week. Where do I find calorie information?  The number of calories in a food can be found on a Nutrition Facts label. If a food does not have a Nutrition Facts label, try to look up the calories online or ask your dietitian for help. Remember that calories are listed per serving. If you choose to have more than one serving of a food, you will have to multiply the calories per serving by the amount of servings you plan to eat. For example, the label on a package of bread might say that a serving size is 1 slice and that there are 90 calories in a serving. If you eat 1 slice, you will have eaten 90 calories. If you eat 2 slices, you will have eaten 180 calories. How do I keep a food log? Immediately after each meal, record the following information in your food log:  What you ate. Don't forget to include toppings, sauces, and other extras on the food.  How much you ate. This can be measured in cups, ounces, or number of items.  How many calories each food and drink had.  The total number of calories in the meal. Keep your food log near you, such as in a small notebook in your pocket, or use a mobile app or website. Some programs will calculate calories for you and show you how many calories you have left for the day to meet your goal. What are some calorie counting tips?   Use your calories on foods and drinks that will fill you up and not leave you hungry: ? Some examples of foods that fill you up are nuts and nut butters, vegetables, lean  proteins, and high-fiber foods like whole grains. High-fiber foods are foods with more than 5 g fiber per serving. ? Drinks such as sodas, specialty coffee drinks, alcohol, and juices have a lot of calories, yet do not fill you up.  Eat nutritious foods and avoid empty calories. Empty calories are calories you get from foods or beverages that do not have many vitamins or protein, such as candy, sweets, and soda. It is better to have a nutritious high-calorie food (such as an avocado) than a food with few nutrients (such as a bag of chips).  Know how many calories are in the foods you eat most often. This will help you calculate calorie counts faster.  Pay attention to calories in drinks. Low-calorie drinks include water and unsweetened drinks.  Pay attention to nutrition labels for "low fat" or "fat free" foods. These foods sometimes have the same amount of calories or more calories than the full fat versions. They also often  have added sugar, starch, or salt, to make up for flavor that was removed with the fat.  Find a way of tracking calories that works for you. Get creative. Try different apps or programs if writing down calories does not work for you. What are some portion control tips?  Know how many calories are in a serving. This will help you know how many servings of a certain food you can have.  Use a measuring cup to measure serving sizes. You could also try weighing out portions on a kitchen scale. With time, you will be able to estimate serving sizes for some foods.  Take some time to put servings of different foods on your favorite plates, bowls, and cups so you know what a serving looks like.  Try not to eat straight from a bag or box. Doing this can lead to overeating. Put the amount you would like to eat in a cup or on a plate to make sure you are eating the right portion.  Use smaller plates, glasses, and bowls to prevent overeating.  Try not to multitask (for example, watch  TV or use your computer) while eating. If it is time to eat, sit down at a table and enjoy your food. This will help you to know when you are full. It will also help you to be aware of what you are eating and how much you are eating. What are tips for following this plan? Reading food labels  Check the calorie count compared to the serving size. The serving size may be smaller than what you are used to eating.  Check the source of the calories. Make sure the food you are eating is high in vitamins and protein and low in saturated and trans fats. Shopping  Read nutrition labels while you shop. This will help you make healthy decisions before you decide to purchase your food.  Make a grocery list and stick to it. Cooking  Try to cook your favorite foods in a healthier way. For example, try baking instead of frying.  Use low-fat dairy products. Meal planning  Use more fruits and vegetables. Half of your plate should be fruits and vegetables.  Include lean proteins like poultry and fish. How do I count calories when eating out?  Ask for smaller portion sizes.  Consider sharing an entree and sides instead of getting your own entree.  If you get your own entree, eat only half. Ask for a box at the beginning of your meal and put the rest of your entree in it so you are not tempted to eat it.  If calories are listed on the menu, choose the lower calorie options.  Choose dishes that include vegetables, fruits, whole grains, low-fat dairy products, and lean protein.  Choose items that are boiled, broiled, grilled, or steamed. Stay away from items that are buttered, battered, fried, or served with cream sauce. Items labeled "crispy" are usually fried, unless stated otherwise.  Choose water, low-fat milk, unsweetened iced tea, or other drinks without added sugar. If you want an alcoholic beverage, choose a lower calorie option such as a glass of wine or light beer.  Ask for dressings,  sauces, and syrups on the side. These are usually high in calories, so you should limit the amount you eat.  If you want a salad, choose a garden salad and ask for grilled meats. Avoid extra toppings like bacon, cheese, or fried items. Ask for the dressing on the side, or ask  for olive oil and vinegar or lemon to use as dressing.  Estimate how many servings of a food you are given. For example, a serving of cooked rice is  cup or about the size of half a baseball. Knowing serving sizes will help you be aware of how much food you are eating at restaurants. The list below tells you how big or small some common portion sizes are based on everyday objects: ? 1 oz--4 stacked dice. ? 3 oz--1 deck of cards. ? 1 tsp--1 die. ? 1 Tbsp-- a ping-pong ball. ? 2 Tbsp--1 ping-pong ball. ?  cup-- baseball. ? 1 cup--1 baseball. Summary  Calorie counting means keeping track of how many calories you eat and drink each day. If you eat fewer calories than your body needs, you should lose weight.  A healthy amount of weight to lose per week is usually 1-2 lb (0.5-0.9 kg). This usually means reducing your daily calorie intake by 500-750 calories.  The number of calories in a food can be found on a Nutrition Facts label. If a food does not have a Nutrition Facts label, try to look up the calories online or ask your dietitian for help.  Use your calories on foods and drinks that will fill you up, and not on foods and drinks that will leave you hungry.  Use smaller plates, glasses, and bowls to prevent overeating. This information is not intended to replace advice given to you by your health care provider. Make sure you discuss any questions you have with your health care provider. Document Revised: 10/27/2017 Document Reviewed: 01/08/2016 Elsevier Patient Education  2020 [employer] Inc.   Fat and Cholesterol Restricted Eating Plan Getting too much fat and cholesterol in your diet may cause health problems.  Choosing the right foods helps keep your fat and cholesterol at normal levels. This can keep you from getting certain diseases. Your doctor may recommend an eating plan that includes:  Total fat: ______% or less of total calories a day.  Saturated fat: ______% or less of total calories a day.  Cholesterol: less than _________mg a day.  Fiber: ______g a day. What are tips for following this plan? Meal planning  At meals, divide your plate into four equal parts: ? Fill one-half of your plate with vegetables and green salads. ? Fill one-fourth of your plate with whole grains. ? Fill one-fourth of your plate with low-fat (lean) protein foods.  Eat fish that is high in omega-3 fats at least two times a week. This includes mackerel, tuna, sardines, and salmon.  Eat foods that are high in fiber, such as whole grains, beans, apples, broccoli, carrots, peas, and barley. General tips   Work with your doctor to lose weight if you need to.  Avoid: ? Foods with added sugar. ? Fried foods. ? Foods with partially hydrogenated oils.  Limit alcohol intake to no more than 1 drink a day for nonpregnant women and 2 drinks a day for men. One drink equals 12 oz of beer, 5 oz of wine, or 1 oz of hard liquor. Reading food labels  Check food labels for: ? Trans fats. ? Partially hydrogenated oils. ? Saturated fat (g) in each serving. ? Cholesterol (mg) in each serving. ? Fiber (g) in each serving.  Choose foods with healthy fats, such as: ? Monounsaturated fats. ? Polyunsaturated fats. ? Omega-3 fats.  Choose grain products that have whole grains. Look for the word "whole" as the first word in the ingredient list.  Cooking  Cook foods using low-fat methods. These include baking, boiling, grilling, and broiling.  Eat more home-cooked foods. Eat at restaurants and buffets less often.  Avoid cooking using saturated fats, such as butter, cream, palm oil, palm kernel oil, and coconut  oil. Recommended foods  Fruits  All fresh, canned (in natural juice), or frozen fruits. Vegetables  Fresh or frozen vegetables (raw, steamed, roasted, or grilled). Green salads. Grains  Whole grains, such as whole wheat or whole grain breads, crackers, cereals, and pasta. Unsweetened oatmeal, bulgur, barley, quinoa, or brown rice. Corn or whole wheat flour tortillas. Meats and other protein foods  Ground beef (85% or leaner), grass-fed beef, or beef trimmed of fat. Skinless chicken or [place]. Ground chicken or [place]. Pork trimmed of fat. All fish and seafood. Egg whites. Dried beans, peas, or lentils. Unsalted nuts or seeds. Unsalted canned beans. Nut butters without added sugar or oil. Dairy  Low-fat or nonfat dairy products, such as skim or 1% milk, 2% or reduced-fat cheeses, low-fat and fat-free ricotta or cottage cheese, or plain low-fat and nonfat yogurt. Fats and oils  Tub margarine without trans fats. Light or reduced-fat mayonnaise and salad dressings. Avocado. Olive, canola, sesame, or safflower oils. The items listed above may not be a complete list of foods and beverages you can eat. Contact a dietitian for more information. Foods to avoid Fruits  Canned fruit in heavy syrup. Fruit in cream or butter sauce. Fried fruit. Vegetables  Vegetables cooked in cheese, cream, or butter sauce. Fried vegetables. Grains  White bread. White pasta. White rice. Cornbread. Bagels, pastries, and croissants. Crackers and snack foods that contain trans fat and hydrogenated oils. Meats and other protein foods  Fatty cuts of meat. Ribs, chicken wings, bacon, sausage, bologna, salami, chitterlings, fatback, hot dogs, bratwurst, and packaged lunch meats. Liver and organ meats. Whole eggs and egg yolks. Chicken and [place] with skin. Fried meat. Dairy  Whole or 2% milk, cream, half-and-half, and cream cheese. Whole milk cheeses. Whole-fat or sweetened yogurt. Full-fat cheeses. Nondairy  creamers and whipped toppings. Processed cheese, cheese spreads, and cheese curds. Beverages  Alcohol. Sugar-sweetened drinks such as sodas, lemonade, and fruit drinks. Fats and oils  Butter, stick margarine, lard, shortening, ghee, or bacon fat. Coconut, palm kernel, and palm oils. Sweets and desserts  Corn syrup, sugars, honey, and molasses. Candy. Jam and jelly. Syrup. Sweetened cereals. Cookies, pies, cakes, donuts, muffins, and ice cream. The items listed above may not be a complete list of foods and beverages you should avoid. Contact a dietitian for more information. Summary  Choosing the right foods helps keep your fat and cholesterol at normal levels. This can keep you from getting certain diseases.  At meals, fill one-half of your plate with vegetables and green salads.  Eat high-fiber foods, like whole grains, beans, apples, carrots, peas, and barley.  Limit added sugar, saturated fats, alcohol, and fried foods. This information is not intended to replace advice given to you by your health care provider. Make sure you discuss any questions you have with your health care provider. Document Revised: 10/11/2017 Document Reviewed: 10/25/2016 Elsevier Patient Education  2020 [employer].

## 2019-04-15 NOTE — Progress Notes (Addendum)
Patient: Tammy Marshall, Female    DOB: 23-May-1975, 44 y.o.   MRN: [id number] Visit Date: 04/15/2019  Today's Provider: Jairo Ben, FNP   Chief Complaint  Patient presents with  . New Patient (Initial Visit)   Subjective:    New Patient Tammy Marshall is a 44 y.o. female who presents today for health maintenance and establish care as a new patient.Patient reports that she was a former patient at [facility] and states that she is seen by [facility] for her gynecology exams, patient reports that last pap was <10days ago and reports that it was normal. She feels fairly well, patient would like to address weight gain in the past month, patient reports in the past two months she has gained 20 bs.Patint states that she would also like to address swelling in her lower extremity, patient reports that she had a history before with elevated blood pressure.  She reports she is not actively exercising . She reports she is sleeping fairly well.  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 She has cut out all sodas and drinking water 60 oz a day over the past one month. She has two weeks ago cut out sugar in her coffee. She has tried eating  Healthy/ She tries to work out twice per week.   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  She has joint aches at time, non specific just notices aches at times. Body mechanics sitting could be better, neck pain at times with sitting.   She feels she has a hard time focusing on things. She is able to get tasks  Completed, she has notices this in the past 1- 2 months.  Patient's last menstrual period was 03/23/2019 (exact date)..  Tubal ligation history. Menstrual are regular every 28 days.  Herpes test was seropositive HSV 2 by blood at gynecologist. She has never had an out break or any associated symptoms. . She would like to have Valtrex in case she does.   Patient  denies any fever,  body aches,chills, rash, chest pain, shortness of breath, nausea, vomiting, or diarrhea.   Former smoker quit over one year ago.  She takes aswaganddha over the counter.   She has no other concerns today. She denies any suicidal or homicidal ideations or intents.   --------------------------------   Review of Systems  Constitutional: Positive for fatigue and unexpected weight change. Negative for activity change, appetite change, chills, diaphoresis and fever.  Eyes: Negative.   Respiratory: Negative.   Cardiovascular: Positive for leg swelling. Negative for chest pain and palpitations.  Gastrointestinal: Negative.   Endocrine: Negative.   Genitourinary: Negative.   Musculoskeletal: Positive for arthralgias, joint swelling, myalgias, neck pain and neck stiffness. Negative for back pain and gait problem.  Skin: Negative.   Neurological: Positive for facial asymmetry, numbness and headaches. Negative for dizziness, tremors, seizures, syncope, speech difficulty, weakness and light-headedness.  Hematological: Negative.   Psychiatric/Behavioral: Positive for decreased concentration, dysphoric mood and sleep disturbance. Negative for agitation, behavioral problems, confusion, hallucinations, self-injury and suicidal ideas. The patient is nervous/anxious. The patient is not hyperactive.   All other systems reviewed and are negative.   Social History She  reports that she has been smoking cigarettes. She has never used smokeless tobacco. She reports current alcohol use. She reports that she does not use drugs. Social History   Socioeconomic History  . Marital status:  Single    Spouse name: Not on file  . Number of children: 2  . Years of education: Not on file  . Highest education level: Not on file  Occupational History  . Not on file  Tobacco Use  . Smoking status: Current Some Day Smoker    Types: Cigarettes  . Smokeless tobacco: Never Used  Substance and Sexual Activity  .  Alcohol use: Yes    Comment: 1-4/wk  . Drug use: No  . Sexual activity: Yes    Birth control/protection: Surgical    Comment: tubal ligation  Other Topics Concern  . Not on file  Social History Narrative  . Not on file   Social Determinants of Health   Financial Resource Strain:   . Difficulty of Paying Living Expenses: Not on file  Food Insecurity:   . Worried About Programme researcher, broadcasting/film/video in the Last Year: Not on file  . Ran Out of Food in the Last Year: Not on file  Transportation Needs:   . Lack of Transportation (Medical): Not on file  . Lack of Transportation (Non-Medical): Not on file  Physical Activity:   . Days of Exercise per Week: Not on file  . Minutes of Exercise per Session: Not on file  Stress:   . Feeling of Stress : Not on file  Social Connections:   . Frequency of Communication with Friends and Family: Not on file  . Frequency of Social Gatherings with Friends and Family: Not on file  . Attends Religious Services: Not on file  . Active Member of Clubs or Organizations: Not on file  . Attends Banker Meetings: Not on file  . Marital Status: Not on file    Patient Active Problem List   Diagnosis Date Noted  . Constipation 08/16/2017  . Rectal bleeding 08/16/2017  . Family history of pancreatic cancer 08/16/2017  . Family history of colon cancer 08/16/2017  . Cyst of left ovary 08/16/2017  . Bilateral carpal tunnel syndrome 05/09/2017  . Arm numbness 04/20/2017  . Facial twitching 04/20/2017  . Tobacco abuse 02/17/2017    Past Surgical History:  Procedure Laterality Date  . DILATION AND CURETTAGE OF UTERUS  1995  . TUBAL LIGATION      Family History  Family Status  Relation Name Status  . Mother  Alive  . Father  Alive  . MGM  Alive  . PGM  Deceased  . Emelda Brothers  Deceased  . MGF  Deceased  . Oneal Grout  (Not Specified)  . PGF  (Not Specified)   Her family history includes Arthritis in her maternal grandmother, mother, and  paternal grandmother; Asthma in her mother; Bipolar disorder in her maternal grandmother; Brain cancer (age of onset: 84) in her paternal aunt; Brain cancer (age of onset: 70) in her maternal grandfather; Cancer in her mother, paternal grandfather, and paternal grandmother; Colon cancer (age of onset: 82) in her father; Heart attack in her father; Hyperlipidemia in her father; Hypertension in her father; Pancreatic cancer (age of onset: 36) in her paternal uncle.     No Known Allergies  Previous Medications   ASHWAGANDHA PO    Take by mouth.   OVER THE COUNTER MEDICATION    in the morning and at bedtime. Patient reports taking Goli apple cider vinegar gummies    Patient Care Team: Berniece Pap, FNP as PCP - General (Family Medicine)      Objective:   Vitals: LMP 03/23/2019 (Exact Date)  Physical Exam Vitals reviewed.  Constitutional:      General: She is not in acute distress.    Appearance: She is well-developed. She is not diaphoretic.     Interventions: She is not intubated. HENT:     Head: Normocephalic and atraumatic.     Right Ear: External ear normal.     Left Ear: External ear normal.     Nose: Nose normal.     Mouth/Throat:     Pharynx: No oropharyngeal exudate.  Eyes:     General: Lids are normal. No scleral icterus.       Right eye: No discharge.        Left eye: No discharge.     Conjunctiva/sclera: Conjunctivae normal.     Right eye: Right conjunctiva is not injected. No exudate or hemorrhage.    Left eye: Left conjunctiva is not injected. No exudate or hemorrhage.    Pupils: Pupils are equal, round, and reactive to light.  Neck:     Thyroid: No thyroid mass or thyromegaly.     Vascular: Normal carotid pulses. No carotid bruit, hepatojugular reflux or JVD.     Trachea: Trachea and phonation normal. No tracheal tenderness or tracheal deviation.     Meningeal: Brudzinski's sign and Kernig's sign absent.  Cardiovascular:     Rate and Rhythm: Normal rate  and regular rhythm.     Pulses: Normal pulses.          Radial pulses are 2+ on the right side and 2+ on the left side.       Dorsalis pedis pulses are 2+ on the right side and 2+ on the left side.       Posterior tibial pulses are 2+ on the right side and 2+ on the left side.     Heart sounds: Normal heart sounds, S1 normal and S2 normal. Heart sounds not distant. No murmur. No friction rub. No gallop.   Pulmonary:     Effort: Pulmonary effort is normal. No tachypnea, bradypnea, accessory muscle usage or respiratory distress. She is not intubated.     Breath sounds: Normal breath sounds. No stridor. No wheezing or rales.  Chest:     Chest wall: No tenderness.  Abdominal:     General: Bowel sounds are normal. There is no distension or abdominal bruit.     Palpations: Abdomen is soft. There is no shifting dullness, fluid wave, hepatomegaly, splenomegaly, mass or pulsatile mass.     Tenderness: There is no abdominal tenderness. There is no right CVA tenderness, left CVA tenderness, guarding or rebound.     Hernia: No hernia is present.  Genitourinary:    Comments: Deferred breast and Bimanual / PAP to gynecology. Mammogram has been ordered by gynecologist she has not gone yet but plans to. PAP complete. Breast exam was at gynecologist. Denies any concerns or changes. She sees gynecologist regularly.  Musculoskeletal:        General: No tenderness or deformity. Normal range of motion.     Cervical back: Full passive range of motion without pain, normal range of motion and neck supple. No edema, erythema or rigidity. No spinous process tenderness or muscular tenderness. Normal range of motion.     Right lower leg: Edema present.     Left lower leg: Edema present.     Comments: Very scant bilateral ankle edema non pitting.   Lymphadenopathy:     Head:     Right side of head: No submental, submandibular, tonsillar,  preauricular, posterior auricular or occipital adenopathy.     Left side of head:  No submental, submandibular, tonsillar, preauricular, posterior auricular or occipital adenopathy.     Cervical: No cervical adenopathy.     Right cervical: No superficial, deep or posterior cervical adenopathy.    Left cervical: No superficial, deep or posterior cervical adenopathy.     Upper Body:     Right upper body: No supraclavicular or pectoral adenopathy.     Left upper body: No supraclavicular or pectoral adenopathy.  Skin:    General: Skin is warm and dry.     Capillary Refill: Capillary refill takes less than 2 seconds.     Coloration: Skin is not pale.     Findings: No abrasion, bruising, burn, ecchymosis, erythema, lesion, petechiae or rash.     Nails: There is no clubbing.  Neurological:     Mental Status: She is alert and oriented to person, place, and time.     GCS: GCS eye subscore is 4. GCS verbal subscore is 5. GCS motor subscore is 6.     Cranial Nerves: No cranial nerve deficit.     Sensory: No sensory deficit.     Motor: No weakness, tremor, atrophy, abnormal muscle tone or seizure activity.     Coordination: Coordination normal.     Gait: Gait normal.     Deep Tendon Reflexes: Reflexes are normal and symmetric. Reflexes normal. Babinski sign absent on the right side. Babinski sign absent on the left side.     Reflex Scores:      Tricep reflexes are 2+ on the right side and 2+ on the left side.      Bicep reflexes are 2+ on the right side and 2+ on the left side.      Brachioradialis reflexes are 2+ on the right side and 2+ on the left side.      Patellar reflexes are 2+ on the right side and 2+ on the left side.      Achilles reflexes are 2+ on the right side and 2+ on the left side. Psychiatric:        Mood and Affect: Mood normal.        Speech: Speech normal.        Behavior: Behavior normal.        Thought Content: Thought content normal.        Judgment: Judgment normal.      Depression Screen PHQ 2/9 Scores 04/15/2019  PHQ - 2 Score 4  PHQ- 9 Score  15  Discussed scoring, she feels she is doing fine, does report decreased concentration, she does not want to try medications at this time. She can do counseling.     Assessment & Plan:     Routine Health Maintenance and Physical Exam  Exercise Activities and Dietary recommendations Goals   None      There is no immunization history on file for this patient.  Health Maintenance  Topic Date Due  . TETANUS/TDAP  08/07/1993  . INFLUENZA VACCINE  05/22/2019 (Originally 09/22/2018)  . PAP SMEAR-Modifier  02/18/2020  . HIV Screening  Completed    She has yearly eye exam schedules. Also recommend yearly dental exam.  Discussed health benefits of physical activity, and encouraged her to engage in regular exercise appropriate for her age and condition.     Encounter for routine adult health examination without abnormal findings - Plan: POCT urinalysis dipstick  Vitamin D deficiency - Plan: VITAMIN D 25  Hydroxy (Vit-D Deficiency, Fractures)  Weight gain - Plan: TSH, Comprehensive metabolic panel, CBC with Differential/Platelet, Lipid Profile, VITAMIN D 25 Hydroxy (Vit-D Deficiency, Fractures), Hemoglobin A1c  Fatigue, unspecified type - Plan: TSH, Comprehensive metabolic panel, CBC with Differential/Platelet, Lipid Profile, VITAMIN D 25 Hydroxy (Vit-D Deficiency, Fractures)  Body mass index (BMI) of 32.0-32.9 in adult - Plan: Hemoglobin A1c  Arthralgia, unspecified joint  Orders Placed This Encounter  Procedures  . TSH  . Comprehensive metabolic panel    Standing Status:   Future    Standing Expiration Date:   05/13/2019  . CBC with Differential/Platelet  . Lipid Profile  . VITAMIN D 25 Hydroxy (Vit-D Deficiency, Fractures)  . Hemoglobin A1c  . POCT urinalysis dipstick   Meds ordered this encounter  Medications  . valACYclovir (VALTREX) 1000 MG tablet    Sig: Take 1 tablet (1,000 mg total) by mouth 2 (two) times daily. Start as soon as possible  within 72 hours of first  symptom onset. Use for 5 to 10 days as directed.    Dispense:  20 tablet    Refill:  0   Results for orders placed or performed in visit on 04/15/19 (from the past 24 hour(s))  POCT urinalysis dipstick     Status: Normal   Collection Time: 04/15/19  9:03 AM  Result Value Ref Range   Color, UA yellow    Clarity, UA clear    Glucose, UA Negative Negative   Bilirubin, UA negative    Ketones, UA negative    Spec Grav, UA 1.015 1.010 - 1.025   Blood, UA negative    pH, UA 5.0 5.0 - 8.0   Protein, UA Negative Negative   Urobilinogen, UA 0.2 0.2 or 1.0 E.U./dL   Nitrite, UA negative    Leukocytes, UA Negative Negative   Appearance     Odor       Return in 1 month (on 05/13/2019).   Advised patient call the office or your primary car e doctor for an appointment if no improvement within 72 hours or if any symptoms change or worsen at any time  Advised ER or urgent Care if after hours or on weekend. Call 911 for emergency symptoms at any time.Patinet verbalized understanding of all instructions given/reviewed and treatment plan and has no further questions or concerns at this time.     Addressed extensive list of chronic and acute medical problems today requiring 35 minutes reviewing her medical record, counseling patient regarding her conditions and coordination of care.   The entirety of the information documented in the History of Present Illness, Review of Systems and Physical Exam were personally obtained by me. Portions of this information were initially documented by the  Certified Medical Assistant whose name is documented in Epic and reviewed by me for thoroughness and accuracy.  I have personally performed the exam and reviewed the chart and it is accurate to the best of my knowledge.  Museum/gallery conservator has been used and any errors in dictation or transcription are unintentional.  Eula Fried. Flinchum FNP-C  [facility]    --------------------------------------------------------------------

## 2019-04-16 LAB — CBC WITH DIFFERENTIAL/PLATELET
Basophils Absolute: 0 10*3/uL (ref 0.0–0.2)
Basos: 1 %
EOS (ABSOLUTE): 0.2 10*3/uL (ref 0.0–0.4)
Eos: 3 %
Hematocrit: 40.2 % (ref 34.0–46.6)
Hemoglobin: 13.6 g/dL (ref 11.1–15.9)
Immature Grans (Abs): 0 10*3/uL (ref 0.0–0.1)
Immature Granulocytes: 0 %
Lymphocytes Absolute: 2 10*3/uL (ref 0.7–3.1)
Lymphs: 33 %
MCH: 29.8 pg (ref 26.6–33.0)
MCHC: 33.8 g/dL (ref 31.5–35.7)
MCV: 88 fL (ref 79–97)
Monocytes Absolute: 0.4 10*3/uL (ref 0.1–0.9)
Monocytes: 7 %
Neutrophils Absolute: 3.3 10*3/uL (ref 1.4–7.0)
Neutrophils: 56 %
Platelets: 318 10*3/uL (ref 150–450)
RBC: 4.57 x10E6/uL (ref 3.77–5.28)
RDW: 12.7 % (ref 11.7–15.4)
WBC: 5.9 10*3/uL (ref 3.4–10.8)

## 2019-04-16 LAB — LIPID PANEL
Chol/HDL Ratio: 4.7 ratio — ABNORMAL HIGH (ref 0.0–4.4)
Cholesterol, Total: 196 mg/dL (ref 100–199)
HDL: 42 mg/dL (ref >39)
LDL Chol Calc (NIH): 129 mg/dL — ABNORMAL HIGH (ref 0–99)
Triglycerides: 139 mg/dL (ref 0–149)
VLDL Cholesterol Cal: 25 mg/dL (ref 5–40)

## 2019-04-16 LAB — TSH: TSH: 2.25 u[IU]/mL (ref 0.450–4.500)

## 2019-04-16 LAB — VITAMIN D 25 HYDROXY (VIT D DEFICIENCY, FRACTURES): Vit D, 25-Hydroxy: 25.5 ng/mL — ABNORMAL LOW (ref 30.0–100.0)

## 2019-04-18 LAB — COMP. METABOLIC PANEL (12)
AST: 15 IU/L (ref 0–40)
Albumin/Globulin Ratio: 1.7 (ref 1.2–2.2)
Albumin: 4.3 g/dL (ref 3.8–4.8)
Alkaline Phosphatase: 70 IU/L (ref 39–117)
BUN/Creatinine Ratio: 8 — ABNORMAL LOW (ref 9–23)
BUN: 7 mg/dL (ref 6–24)
Bilirubin Total: <0.2 mg/dL (ref 0.0–1.2)
Calcium: 9.2 mg/dL (ref 8.7–10.2)
Chloride: 104 mmol/L (ref 96–106)
Creatinine, Ser: 0.85 mg/dL (ref 0.57–1.00)
GFR calc Af Amer: 96 mL/min/{1.73_m2} (ref >59)
GFR calc non Af Amer: 84 mL/min/{1.73_m2} (ref >59)
Globulin, Total: 2.5 g/dL (ref 1.5–4.5)
Glucose: 86 mg/dL (ref 65–99)
Potassium: 4.3 mmol/L (ref 3.5–5.2)
Sodium: 139 mmol/L (ref 134–144)
Total Protein: 6.8 g/dL (ref 6.0–8.5)

## 2019-04-18 LAB — SPECIMEN STATUS REPORT

## 2019-04-18 LAB — HEMOGLOBIN A1C
Est. average glucose Bld gHb Est-mCnc: 105 mg/dL
Hgb A1c MFr Bld: 5.3 % (ref 4.8–5.6)

## 2019-04-18 NOTE — Progress Notes (Signed)
Kidney and liver function normal. A1c is normal not prediabetic or diabetic. Glucose normal. Thyroid is normal. CBC without anemia or any signs of infection. LDL " bad cholesterol" is elevated.  Discuss lifestyle modification with patient e.g. increase exercise, fiber, fruits, vegetables, lean meat, and omega 3/fish intake and decrease saturated fat.  itamin D is low I recommend taking Vitamin D3 at 4,000 intenational units once daily by mouth. Recheck lab Vitamin D in 4 to 6 months.  Follow up as recommended at office and as needed.

## 2019-05-14 ENCOUNTER — Encounter: Payer: Self-pay | Admitting: Obstetrics and Gynecology

## 2019-05-14 ENCOUNTER — Ambulatory Visit: Payer: Self-pay | Admitting: Adult Health

## 2019-12-27 ENCOUNTER — Ambulatory Visit
Admission: RE | Admit: 2019-12-27 | Discharge: 2019-12-27 | Disposition: A | Discharge status: Home / Self Care | Payer: 59 | Source: Ambulatory Visit | Attending: Adult Health | Admitting: Adult Health

## 2019-12-27 ENCOUNTER — Ambulatory Visit
Admission: RE | Admit: 2019-12-27 | Discharge: 2019-12-27 | Disposition: A | Discharge status: Home / Self Care | Payer: 59 | Attending: Adult Health | Admitting: Adult Health

## 2019-12-27 ENCOUNTER — Other Ambulatory Visit: Payer: Self-pay

## 2019-12-27 ENCOUNTER — Ambulatory Visit (INDEPENDENT_AMBULATORY_CARE_PROVIDER_SITE_OTHER): Payer: 59 | Admitting: Adult Health

## 2019-12-27 ENCOUNTER — Encounter: Payer: Self-pay | Admitting: Adult Health

## 2019-12-27 VITALS — BP 121/72 | HR 95 | Temp 98.1°F | Resp 15 | Wt 184.0 lb

## 2019-12-27 DIAGNOSIS — K5900 Constipation, unspecified: Secondary | ICD-10-CM

## 2019-12-27 DIAGNOSIS — R1032 Left lower quadrant pain: Secondary | ICD-10-CM | POA: Insufficient documentation

## 2019-12-27 DIAGNOSIS — Z6831 Body mass index (BMI) 31.0-31.9, adult: Secondary | ICD-10-CM

## 2019-12-27 MED ORDER — AMOXICILLIN-POT CLAVULANATE 875-125 MG PO TABS: 1.0000 | ORAL_TABLET | Freq: Two times a day (BID) | ORAL | 0 refills | Status: DC

## 2019-12-27 NOTE — Patient Instructions (Addendum)
Amoxicillin; Clavulanic Acid Tablets What is this medicine? AMOXICILLIN; CLAVULANIC ACID (a mox i SIL in; KLAV yoo lan ic AS id) is a penicillin antibiotic. It treats some infections caused by bacteria. It will not work for colds, the flu, or other viruses. This medicine may be used for other purposes; ask your health care provider or pharmacist if you have questions. COMMON BRAND NAME(S): Augmentin What should I tell my health care provider before I take this medicine? They need to know if you have any of these conditions:  bowel disease, like colitis  kidney disease  liver disease  mononucleosis  an unusual or allergic reaction to amoxicillin, penicillin, cephalosporin, other antibiotics, clavulanic acid, other medicines, foods, dyes, or preservatives  pregnant or trying to get pregnant  breast-feeding How should I use this medicine? Take this drug by mouth. Take it as directed on the prescription label at the same time every day. Take it with food at the start of a meal or snack. Take all of this drug unless your health care provider tells you to stop it early. Keep taking it even if you think you are better. Talk to your health care provider about the use of this drug in children. While it may be prescribed for selected conditions, precautions do apply. Overdosage: If you think you have taken too much of this medicine contact a poison control center or emergency room at once. NOTE: This medicine is only for you. Do not share this medicine with others. What if I miss a dose? If you miss a dose, take it as soon as you can. If it is almost time for your next dose, take only that dose. Do not take double or extra doses. What may interact with this medicine?  allopurinol  anticoagulants  birth control pills  methotrexate  probenecid This list may not describe all possible interactions. Give your health care provider a list of all the medicines, herbs, non-prescription drugs, or  dietary supplements you use. Also tell them if you smoke, drink alcohol, or use illegal drugs. Some items may interact with your medicine. What should I watch for while using this medicine? Tell your doctor or healthcare provider if your symptoms do not improve. This medicine may cause serious skin reactions. They can happen weeks to months after starting the medicine. Contact your healthcare provider right away if you notice fevers or flu-like symptoms with a rash. The rash may be red or purple and then turn into blisters or peeling of the skin. Or, you might notice a red rash with swelling of the face, lips or lymph nodes in your neck or under your arms. Do not treat diarrhea with over the counter products. Contact your doctor if you have diarrhea that lasts more than 2 days or if it is severe and watery. If you have diabetes, you may get a false-positive result for sugar in your urine. Check with your doctor or healthcare provider. Birth control pills may not work properly while you are taking this medicine. Talk to your doctor about using an extra method of birth control. What side effects may I notice from receiving this medicine? Side effects that you should report to your doctor or health care professional as soon as possible:  allergic reactions like skin rash, itching or hives, swelling of the face, lips, or tongue  breathing problems  dark urine  fever or chills, sore throat  redness, blistering, peeling, or loosening of the skin, including inside the mouth  seizures  trouble passing urine or change in the amount of urine  unusual bleeding, bruising  unusually weak or tired  white patches or sores in the mouth or throat Side effects that usually do not require medical attention (report to your doctor or health care professional if they continue or are bothersome):  diarrhea  dizziness  headache  nausea, vomiting  stomach upset  vaginal or anal irritation This list  may not describe all possible side effects. Call your doctor for medical advice about side effects. You may report side effects to FDA at 1-800-FDA-1088. Where should I keep my medicine? Keep out of the reach of children and pets. Store at room temperature between 20 and 25 degrees C (68 and 77 degrees F). Throw away any unused drug after the expiration date. NOTE: This sheet is a summary. It may not cover all possible information. If you have questions about this medicine, talk to your doctor, pharmacist, or health care provider.  2020 Elsevier/Gold Standard (2018-09-10 11:55:53) Constipation, Adult Constipation is when a person:  Poops (has a bowel movement) fewer times in a week than normal.  Has a hard time pooping.  Has poop that is dry, hard, or bigger than normal. Follow these instructions at home: Eating and drinking   Eat foods that have a lot of fiber, such as: ? Fresh fruits and vegetables. ? Whole grains. ? Beans.  Eat less of foods that are high in fat, low in fiber, or overly processed, such as: ? [place] fries. ? Hamburgers. ? Cookies. ? Candy. ? Soda.  Drink enough fluid to keep your pee (urine) clear or pale yellow. General instructions  Exercise regularly or as told by your doctor.  Go to the restroom when you feel like you need to poop. Do not hold it in.  Take over-the-counter and prescription medicines only as told by your doctor. These include any fiber supplements.  Do pelvic floor retraining exercises, such as: ? Doing deep breathing while relaxing your lower belly (abdomen). ? Relaxing your pelvic floor while pooping.  Watch your condition for any changes.  Keep all follow-up visits as told by your doctor. This is important. Contact a doctor if:  You have pain that gets worse.  You have a fever.  You have not pooped for 4 days.  You throw up (vomit).  You are not hungry.  You lose weight.  You are bleeding from the anus.  You have  thin, pencil-like poop (stool). Get help right away if:  You have a fever, and your symptoms suddenly get worse.  You leak poop or have blood in your poop.  Your belly feels hard or bigger than normal (is bloated).  You have very bad belly pain.  You feel dizzy or you faint. This information is not intended to replace advice given to you by your health care provider. Make sure you discuss any questions you have with your health care provider. Document Revised: 01/20/2017 Document Reviewed: 07/29/2015 Elsevier Patient Education  2020 [employer]. recommend at night with full glass of liquid at least 8 ounces Psyllium granules or powder for solution What is this medicine? PSYLLIUM (SIL i yum) is a bulk-forming fiber laxative. This medicine is used to treat constipation. Increasing fiber in the diet may also help lower cholesterol and promote heart health for some people. This medicine may be used for other purposes; ask your health care provider or pharmacist if you have questions. COMMON BRAND NAME(S): Fiber Therapy, GenFiber, Geri-Mucil, Hydrocil, Konsyl,  Metamucil, Metamucil MultiHealth, Mucilin, Natural Fiber Therapy, Reguloid What should I tell my health care provider before I take this medicine? They need to know if you have any of these conditions:  blockage in your bowel  difficulty swallowing  inflammatory bowel disease  phenylketonuria  stomach or intestine problems  sudden change in bowel habits lasting more than 2 weeks  an unusual or allergic reaction to psyllium, other medicines, dyes, or preservatives  pregnant or trying or get pregnant  breast-feeding How should I use this medicine? Mix this medicine into a full glass (240 mL) of water or other cool drink. Take this medicine by mouth. Follow the directions on the package labeling, or take as directed by your health care professional. Take your medicine at regular intervals. Do not take your medicine more often  than directed. Talk to your pediatrician regarding the use of this medicine in children. While this drug may be prescribed for children as young as 47 years old for selected conditions, precautions do apply. Overdosage: If you think you have taken too much of this medicine contact a poison control center or emergency room at once. NOTE: This medicine is only for you. Do not share this medicine with others. What if I miss a dose? If you miss a dose, take it as soon as you can. If it is almost time for your next dose, take only that dose. Do not take double or extra doses. What may interact with this medicine? Interactions are not expected. Take this product at least 2 hours before or after other medicines. This list may not describe all possible interactions. Give your health care provider a list of all the medicines, herbs, non-prescription drugs, or dietary supplements you use. Also tell them if you smoke, drink alcohol, or use illegal drugs. Some items may interact with your medicine. What should I watch for while using this medicine? Check with your doctor or health care professional if your symptoms do not start to get better or if they get worse. Stop using this medicine and contact your doctor or health care professional if you have rectal bleeding or if you have to treat your constipation for more than 1 week. These could be signs of a more serious condition. Drink several glasses of water a day while you are taking this medicine. This will help to relieve constipation and prevent dehydration. What side effects may I notice from receiving this medicine? Side effects that you should report to your doctor or health care professional as soon as possible:  allergic reactions like skin rash, itching or hives, swelling of the face, lips, or tongue  breathing problems  chest pain  nausea, vomiting  rectal bleeding  trouble swallowing Side effects that usually do not require medical  attention (report to your doctor or health care professional if they continue or are bothersome):  bloating  gas  stomach cramps This list may not describe all possible side effects. Call your doctor for medical advice about side effects. You may report side effects to FDA at 1-800-FDA-1088. Where should I keep my medicine? Keep out of the reach of children. Store at room temperature between 15 and 30 degrees C (59 and 86 degrees F). Protect from moisture. Throw away any unused medicine after the expiration date. NOTE: This sheet is a summary. It may not cover all possible information. If you have questions about this medicine, talk to your doctor, pharmacist, or health care provider.  2020 Elsevier/Gold Standard (2017-07-04 15:41:08)  Diverticulitis  Diverticulitis is when small pockets in your large intestine (colon) get infected or swollen. This causes stomach pain and watery poop (diarrhea). These pouches are called diverticula. They form in people who have a condition called diverticulosis. Follow these instructions at home: Medicines Take over-the-counter and prescription medicines only as told by your doctor. These include: Antibiotics. Pain medicines. Fiber pills. Probiotics. Stool softeners. Do not drive or use heavy machinery while taking prescription pain medicine. If you were prescribed an antibiotic, take it as told. Do not stop taking it even if you feel better. General instructions  Follow a diet as told by your doctor. When you feel better, your doctor may tell you to change your diet. You may need to eat a lot of fiber. Fiber makes it easier to poop (have bowel movements). Healthy foods with fiber include: Berries. Beans. Lentils. Green vegetables. Exercise 3 or more times a week. Aim for 30 minutes each time. Exercise enough to sweat and make your heart beat faster. Keep all follow-up visits as told. This is important. You may need to have an exam of the large  intestine. This is called a colonoscopy. Contact a doctor if: Your pain does not get better. You have a hard time eating or drinking. You are not pooping like normal. Get help right away if: Your pain gets worse. Your problems do not get better. Your problems get worse very fast. You have a fever. You throw up (vomit) more than one time. You have poop that is: Bloody. Black. Tarry. Summary Diverticulitis is when small pockets in your large intestine (colon) get infected or swollen. Take medicines only as told by your doctor. Follow a diet as told by your doctor. This information is not intended to replace advice given to you by your health care provider. Make sure you discuss any questions you have with your health care provider. Document Revised: 01/20/2017 Document Reviewed: 02/25/2016 Elsevier Patient Education  2020 [employer].

## 2019-12-27 NOTE — Progress Notes (Signed)
Established patient visit   Patient: Tammy Marshall   DOB: [birth year]   44 y.o. Female  MRN: [id number] Visit Date: 12/27/2019  Today's healthcare provider: Jairo BenMichelle Smith Kla Bily, FNP   Chief Complaint  Patient presents with  . Abdominal Pain   Subjective    HPI  Abdominal Pain: Patient complains of abdominal pain. The pain is described as sharp and shooting, and is 6/10 in intensity. Pain is located in the LLQ without radiation. Onset was 3 days ago. Symptoms have been gradually improving since. Aggravating factors: bowel movement and pressure.  Alleviating factors: lying down. Associated symptoms: belching, chills, constipation, nausea and sweats. The patient denies arthralgias, diarrhea, flatus, frequency, hematuria and melena. Denies any mucous, or pus to stool.  She did an enema and got some relief, she then had a colace and got relief, she said it had improved yesterday.  She is tender in left lower quadrant she reports.   She has constipation but never had this issue.    Patient  denies any fever, body aches,chills, rash, chest pain, shortness of breath, nausea, vomiting, or diarrhea.   She was cold on Wednesday, she did not take temperature.   Not taking Diclofenac any longer.   Had carpal tunnel steroid injections in august.  History of tubal ligation 2003. Denies any abnormal vaginal bleeding.  Denies any urinary symptoms.   She sees [facility] for gynecology and has follow up US of pelvic ovaries and appointment for follow up on left ovarian cyst.   Patient  denies any fever, body aches,chills, rash, chest pain, shortness of breath, nausea, vomiting, or diarrhea.  Denies dizziness, lightheadedness, pre syncopal or syncopal episodes.      Medications: Outpatient Medications Prior to Visit  Medication Sig  . OVER THE COUNTER MEDICATION in the morning and at bedtime. Patient reports taking [place] apple cider vinegar gummies  . [DISCONTINUED] ASHWAGANDHA  PO Take by mouth.  . [DISCONTINUED] valACYclovir (VALTREX) 1000 MG tablet Take 1 tablet (1,000 mg total) by mouth 2 (two) times daily. Start as soon as possible  within 72 hours of first symptom onset. Use for 5 to 10 days as directed.   No facility-administered medications prior to visit.    Review of Systems  Constitutional: Positive for chills and fatigue. Negative for activity change, appetite change, diaphoresis and unexpected weight change.  HENT: Negative.   Respiratory: Negative.   Cardiovascular: Negative.   Gastrointestinal: Positive for abdominal pain and constipation. Negative for abdominal distention, anal bleeding, blood in stool, diarrhea, nausea, rectal pain and vomiting.  Genitourinary: Negative.   Musculoskeletal: Negative.   Skin: Negative.  Negative for rash.  Neurological: Negative.   Psychiatric/Behavioral: Negative.     Last CBC Lab Results  Component Value Date   WBC 5.9 04/15/2019   HGB 13.6 04/15/2019   HCT 40.2 04/15/2019   MCV 88 04/15/2019   MCH 29.8 04/15/2019   RDW 12.7 04/15/2019   PLT 318 04/15/2019   Last metabolic panel Lab Results  Component Value Date   GLUCOSE 86 04/15/2019   NA 139 04/15/2019   K 4.3 04/15/2019   CL 104 04/15/2019   BUN 7 04/15/2019   CREATININE 0.85 04/15/2019   GFRNONAA 84 04/15/2019   GFRAA 96 04/15/2019   CALCIUM 9.2 04/15/2019   PROT 6.8 04/15/2019   ALBUMIN 4.3 04/15/2019   LABGLOB 2.5 04/15/2019   AGRATIO 1.7 04/15/2019   BILITOT <0.2 04/15/2019   ALKPHOS 70 04/15/2019   AST 15 04/15/2019  Last lipids Lab Results  Component Value Date   CHOL 196 04/15/2019   HDL 42 04/15/2019   LDLCALC 129 (H) 04/15/2019   TRIG 139 04/15/2019   CHOLHDL 4.7 (H) 04/15/2019   Last hemoglobin A1c Lab Results  Component Value Date   HGBA1C 5.3 04/15/2019   Last thyroid functions Lab Results  Component Value Date   TSH 2.250 04/15/2019   Last vitamin D Lab Results  Component Value Date   VD25OH 25.5 (L)  04/15/2019   Last vitamin B12 and Folate No results found for: VITAMINB12, FOLATE    Objective    BP 121/72   Pulse 95   Temp 98.1 F (36.7 C) (Oral)   Resp 15   Wt 184 lb (83.5 kg)   LMP 12/09/2019 (Exact Date)   SpO2 100%   BMI 31.58 kg/m  BP Readings from Last 3 Encounters:  12/27/19 121/72  04/15/19 110/80  04/10/19 120/80   Wt Readings from Last 3 Encounters:  12/27/19 184 lb (83.5 kg)  04/15/19 190 lb 12.8 oz (86.5 kg)  04/10/19 191 lb (86.6 kg)      Physical Exam Vitals reviewed.  Constitutional:      General: She is not in acute distress.    Appearance: She is well-developed. She is not diaphoretic.     Interventions: She is not intubated. HENT:     Head: Normocephalic and atraumatic.     Right Ear: External ear normal.     Left Ear: External ear normal.     Nose: Nose normal.     Mouth/Throat:     Mouth: Mucous membranes are moist.  Eyes:     General: Lids are normal.        Right eye: No discharge.        Left eye: No discharge.     Conjunctiva/sclera: Conjunctivae normal.     Right eye: Right conjunctiva is not injected. No exudate or hemorrhage.    Left eye: Left conjunctiva is not injected. No exudate or hemorrhage.    Pupils: Pupils are equal, round, and reactive to light.  Neck:     Thyroid: No thyroid mass or thyromegaly.     Vascular: Normal carotid pulses. No carotid bruit, hepatojugular reflux or JVD.     Trachea: Trachea and phonation normal. No tracheal tenderness or tracheal deviation.     Meningeal: Brudzinski's sign and Kernig's sign absent.  Cardiovascular:     Rate and Rhythm: Normal rate and regular rhythm.     Pulses: Normal pulses.          Radial pulses are 2+ on the right side and 2+ on the left side.       Dorsalis pedis pulses are 2+ on the right side and 2+ on the left side.       Posterior tibial pulses are 2+ on the right side and 2+ on the left side.     Heart sounds: Normal heart sounds, S1 normal and S2 normal. Heart  sounds not distant. No murmur heard.  No friction rub. No gallop.   Pulmonary:     Effort: Pulmonary effort is normal. No tachypnea, bradypnea, accessory muscle usage or respiratory distress. She is not intubated.     Breath sounds: Normal breath sounds. No stridor. No wheezing or rales.  Chest:     Chest wall: No tenderness.  Abdominal:     General: Bowel sounds are normal. There is no distension or abdominal bruit.     Palpations:  Abdomen is soft. There is no shifting dullness, fluid wave, hepatomegaly, splenomegaly, mass or pulsatile mass.     Tenderness: There is abdominal tenderness in the left lower quadrant. There is no right CVA tenderness, left CVA tenderness, guarding or rebound. Negative signs include Murphy's sign, Rovsing's sign, McBurney's sign, psoas sign and obturator sign.     Hernia: No hernia is present.  Musculoskeletal:        General: No tenderness or deformity. Normal range of motion.     Cervical back: Full passive range of motion without pain, normal range of motion and neck supple. No edema, erythema or rigidity. No spinous process tenderness or muscular tenderness. Normal range of motion.  Lymphadenopathy:     Head:     Right side of head: No submental, submandibular, tonsillar, preauricular, posterior auricular or occipital adenopathy.     Left side of head: No submental, submandibular, tonsillar, preauricular, posterior auricular or occipital adenopathy.     Cervical: No cervical adenopathy.     Right cervical: No superficial, deep or posterior cervical adenopathy.    Left cervical: No superficial, deep or posterior cervical adenopathy.     Upper Body:     Right upper body: No supraclavicular or pectoral adenopathy.     Left upper body: No supraclavicular or pectoral adenopathy.  Skin:    General: Skin is warm and dry.     Coloration: Skin is not pale.     Findings: No abrasion, bruising, burn, ecchymosis, erythema, lesion, petechiae or rash.     Nails: There  is no clubbing.  Neurological:     General: No focal deficit present.     Mental Status: She is alert and oriented to person, place, and time.     GCS: GCS eye subscore is 4. GCS verbal subscore is 5. GCS motor subscore is 6.     Cranial Nerves: No cranial nerve deficit.     Sensory: No sensory deficit.     Motor: No weakness, tremor, atrophy, abnormal muscle tone or seizure activity.     Coordination: Coordination normal.     Gait: Gait normal.     Deep Tendon Reflexes: Reflexes are normal and symmetric. Reflexes normal. Babinski sign absent on the right side. Babinski sign absent on the left side.     Reflex Scores:      Tricep reflexes are 2+ on the right side and 2+ on the left side.      Bicep reflexes are 2+ on the right side and 2+ on the left side.      Brachioradialis reflexes are 2+ on the right side and 2+ on the left side.      Patellar reflexes are 2+ on the right side and 2+ on the left side.      Achilles reflexes are 2+ on the right side and 2+ on the left side. Psychiatric:        Mood and Affect: Mood normal. Mood is not anxious or depressed.        Speech: Speech normal.        Behavior: Behavior normal.        Thought Content: Thought content normal.        Judgment: Judgment normal.     No results found for any visits on 12/27/19.  Assessment & Plan     LLQ pain - Plan: DG Abd 1 View, CBC with Differential/Platelet  Constipation, unspecified constipation type - Plan: DG Abd 1 View  Body mass index (  BMI) of 31.0-31.9 in adult  Meds ordered this encounter  Medications  . amoxicillin-clavulanate (AUGMENTIN) 875-125 MG tablet    Sig: Take 1 tablet by mouth 2 (two) times daily.    Dispense:  20 tablet    Refill:  0   Discussed side effects.  Orders Placed This Encounter  Procedures  . DG Abd 1 View  . CBC with Differential/Platelet  KUB today, she would like to hold off on CT of abdomen with contrast that was discussed will let me know if she would like  to proceed.   Increase diet, psyllium husk nightly as directed with at least 8 ounces of liquid.  Colace 100 mg daily advised.   Do recommend she have follow up on pelvic ovarian ultrasound. She agrees to going back to [facility] for this.   Red Flags discussed. The patient was given clear instructions to go to ER or return to medical center if any red flags develop, symptoms do not improve, worsen or new problems develop. They verbalized understanding.  LLQ pain - Plan: DG Abd 1 View, CBC with Differential/Platelet  Constipation, unspecified constipation type - Plan: DG Abd 1 View  Body mass index (BMI) of 31.0-31.9 in adult  Meds ordered this encounter  Medications  . amoxicillin-clavulanate (AUGMENTIN) 875-125 MG tablet    Sig: Take 1 tablet by mouth 2 (two) times daily.    Dispense:  20 tablet    Refill:  0  she has no chills and fever now, will cover diverticulitis;   Red Flags discussed. The patient was given clear instructions to go to ER or return to medical center if any red flags develop, symptoms do not improve, worsen or new problems develop. They verbalized understanding.  Return in about 2 weeks (around 01/10/2020), or if symptoms worsen or fail to improve, for at any time for any worsening symptoms, Go to Emergency room/ urgent care if worse.       Jairo Ben, FNP  [facility] [phone] (phone) [phone] (fax)  [facility]

## 2019-12-27 NOTE — Progress Notes (Signed)
She does still have moderate amount of stool in colon and gas. Advise Miralax per package instructions until relieved of constipation, adding  psyllium husks or metamucil per package, increased liquids, warm liquids. Colace per package.   Seek care immediately if worsens, or as discussed. CT of abdomen if worsening is advised. Continue plan of care from office visit today as well.   Sent to [facility].

## 2020-01-02 ENCOUNTER — Other Ambulatory Visit: Payer: Self-pay | Admitting: Adult Health

## 2020-01-02 ENCOUNTER — Encounter: Payer: Self-pay | Admitting: Adult Health

## 2020-01-02 DIAGNOSIS — T3695XA Adverse effect of unspecified systemic antibiotic, initial encounter: Secondary | ICD-10-CM | POA: Insufficient documentation

## 2020-01-02 DIAGNOSIS — B379 Candidiasis, unspecified: Secondary | ICD-10-CM | POA: Insufficient documentation

## 2020-01-02 MED ORDER — FLUCONAZOLE 150 MG PO TABS: 150.0000 mg | ORAL_TABLET | ORAL | 0 refills | Status: DC

## 2020-01-02 NOTE — Progress Notes (Signed)
Meds ordered this encounter  Medications  . fluconazole (DIFLUCAN) 150 MG tablet    Sig: Take 1 tablet (150 mg total) by mouth as directed. Take one tablet by mouth on day 1. May repeat dose of one tablet by mouth on day four.    Dispense:  2 tablet    Refill:  0   Antibiotic-induced yeast infection

## 2020-01-10 ENCOUNTER — Ambulatory Visit: Payer: Self-pay | Admitting: Adult Health

## 2021-01-27 ENCOUNTER — Ambulatory Visit: Payer: Self-pay | Admitting: Obstetrics and Gynecology

## 2021-02-26 NOTE — Progress Notes (Signed)
PCP:  Doreen Beam, FNP   Chief Complaint  Patient presents with   Gynecologic Exam    First two days of cycle are super heavy     HPI:      Ms. Tammy Marshall is a 46 y.o. [phone] who LMP was Patient's last menstrual period was 02/17/2021 (exact date)., presents today for her annual examination.  Her menses are regular every 28-30 days, lasting 7 days. 2 days are heavy, changing products Q30 min with quarter-sized clots. Flow is getting heavier.  Dysmenorrhea mild, improved with NSAIDs. She does not have intermenstrual bleeding. Has never done BC/hormones. No hx of HTN, DVTs, migraines with aura.  Sex activity: single partner, contraception - tubal ligation.  Last Pap: February 17, 2017  Results were: no abnormalities /neg HPV DNA  Hx of STDs: chlamydia in past  Last mammogram: April 12, 2017  Results were: normal--routine follow-up in 12 months There is no FH of breast cancer. There is no FH of ovarian cancer.  The patient does do self-breast exams. There is a FH of colon cancer in her dad (pat uncle didn't have pancreatic cancer as she previously thought). She declined MyRisk testing last yr.   Tobacco use: quit 2 yrs ago  Alcohol use: none No drug use.  Exercise: mod active  She does not get adequate calcium but not Vitamin D in her diet. Not taking Fe supp.  Pt with constipation 6/19 with occas rectal bleeding, hx of hemorrhoids. Referred to GI. Couldn't afford colonoscopy at that time. Constipation improved. Interested in colonoscopy ref with GI.  Past Medical History:  Diagnosis Date   Abnormal Pap smear of cervix    Anxiety    Arthritis    Asthma    Chlamydia 01/2016   Depression    HSV-2 seropositive 2021   Hypercholesterolemia    Hypertension    Ovarian cyst    Overactive bladder     Past Surgical History:  Procedure Laterality Date   DILATION AND CURETTAGE OF UTERUS  1995   TUBAL LIGATION      Family History  Problem Relation Age of Onset    Arthritis Mother    Asthma Mother    Cancer Mother        cervical   Hyperlipidemia Father    Hypertension Father    Heart attack Father        old/healed   Colon cancer Father 30   Arthritis Maternal Grandmother    Bipolar disorder Maternal Grandmother    Brain cancer Maternal Grandfather 78   Arthritis Paternal Grandmother    Cancer Paternal Grandmother        unknown   Cancer Paternal Grandfather        brain   Brain cancer Paternal Aunt 12    Social History   Socioeconomic History   Marital status: Single    Spouse name: Not on file   Number of children: 2   Years of education: Not on file   Highest education level: Not on file  Occupational History   Not on file  Tobacco Use   Smoking status: Former    Types: Cigarettes    Quit date: 03/22/2018    Years since quitting: 2.9   Smokeless tobacco: Never  Vaping Use   Vaping Use: Never used  Substance and Sexual Activity   Alcohol use: Yes    Comment: 1-4/wk   Drug use: No   Sexual activity: Yes    Birth control/protection: Surgical  Comment: tubal ligation  Other Topics Concern   Not on file  Social History Narrative   Not on file   Social Determinants of Health   Financial Resource Strain: Not on file  Food Insecurity: Not on file  Transportation Needs: Not on file  Physical Activity: Not on file  Stress: Not on file  Social Connections: Not on file  Intimate Partner Violence: Not on file    Outpatient Medications Prior to Visit  Medication Sig Dispense Refill   amoxicillin-clavulanate (AUGMENTIN) 875-125 MG tablet Take 1 tablet by mouth 2 (two) times daily. 20 tablet 0   fluconazole (DIFLUCAN) 150 MG tablet Take 1 tablet (150 mg total) by mouth as directed. Take one tablet by mouth on day 1. May repeat dose of one tablet by mouth on day four. 2 tablet 0   OVER THE COUNTER MEDICATION in the morning and at bedtime. Patient reports taking Goli apple cider vinegar gummies     No facility-administered  medications prior to visit.      ROS:  Review of Systems  Constitutional:  Negative for fatigue, fever and unexpected weight change.  Respiratory:  Negative for cough, shortness of breath and wheezing.   Cardiovascular:  Negative for chest pain, palpitations and leg swelling.  Gastrointestinal:  Negative for blood in stool, constipation, diarrhea, nausea and vomiting.  Endocrine: Negative for cold intolerance, heat intolerance and polyuria.  Genitourinary:  Positive for menstrual problem. Negative for dyspareunia, dysuria, flank pain, frequency, genital sores, hematuria, pelvic pain, urgency, vaginal bleeding, vaginal discharge and vaginal pain.  Musculoskeletal:  Positive for arthralgias. Negative for back pain, joint swelling and myalgias.  Skin:  Negative for rash.  Neurological:  Negative for dizziness, syncope, light-headedness, numbness and headaches.  Hematological:  Negative for adenopathy.  Psychiatric/Behavioral:  Negative for agitation, confusion, sleep disturbance and suicidal ideas. The patient is not nervous/anxious.  BREAST: No symptoms   Objective: BP 106/70    Ht 5' 4.5" (1.638 m)    Wt 190 lb (86.2 kg)    LMP 02/17/2021 (Exact Date)    BMI 32.11 kg/m    Physical Exam Constitutional:      Appearance: She is well-developed.  Genitourinary:     Vulva normal.     Right Labia: No rash, tenderness or lesions.    Left Labia: No tenderness, lesions or rash.    No vaginal discharge, erythema or tenderness.      Right Adnexa: not tender and no mass present.    Left Adnexa: not tender and no mass present.    No cervical friability or polyp.     Uterus is not enlarged or tender.  Breasts:    Right: No mass, nipple discharge, skin change or tenderness.     Left: No mass, nipple discharge, skin change or tenderness.  Neck:     Thyroid: No thyromegaly.  Cardiovascular:     Rate and Rhythm: Normal rate and regular rhythm.     Heart sounds: Normal heart sounds. No  murmur heard. Pulmonary:     Effort: Pulmonary effort is normal.     Breath sounds: Normal breath sounds.  Abdominal:     Palpations: Abdomen is soft.     Tenderness: There is no abdominal tenderness. There is no guarding or rebound.  Musculoskeletal:        General: Normal range of motion.     Cervical back: Normal range of motion.  Lymphadenopathy:     Cervical: No cervical adenopathy.  Neurological:  General: No focal deficit present.     Mental Status: She is alert and oriented to person, place, and time.     Cranial Nerves: No cranial nerve deficit.  Skin:    General: Skin is warm and dry.  Psychiatric:        Mood and Affect: Mood normal.        Behavior: Behavior normal.        Thought Content: Thought content normal.        Judgment: Judgment normal.  Vitals reviewed.   RESULTS:  Results for orders placed or performed in visit on 03/01/21 (from the past 24 hour(s))  POCT hemoglobin     Status: Abnormal   Collection Time: 03/01/21  9:15 AM  Result Value Ref Range   Hemoglobin 15.1 (A) 11 - 14.6 g/dL     Assessment/Plan: Encounter for annual routine gynecological examination  Cervical cancer screening - Plan: Cytology - PAP  Screening for HPV (human papillomavirus) - Plan: Cytology - PAP  Encounter for screening mammogram for malignant neoplasm of breast - Plan: MM 3D SCREEN BREAST BILATERAL; [facility]  Menorrhagia with regular cycle - Plan: POCT hemoglobin, US PELVIC COMPLETE WITH TRANSVAGINAL; WNL HgB. Worsening flow with periods. Check GYN u/s. Will f/u with results. Discussed hormones, leio tx depending on what is found on u/s.   Screening for colon cancer - Plan: Ambulatory referral to Gastroenterology; refer to GI for scr colonoscopy due to age/FH.  Family history of colon cancer - Plan: Ambulatory referral to Gastroenterology; pt doesn't qualify for cancer genetic testing.           GYN counsel breast self exam, mammography screening,  adequate intake of calcium and vitamin D, diet and exercise     F/U  Return in about 1 year (around 03/01/2022).  Eulah Walkup B. Karle Desrosier, PA-C 03/01/2021 3:05 PM

## 2021-03-01 ENCOUNTER — Other Ambulatory Visit (HOSPITAL_COMMUNITY)
Admission: RE | Admit: 2021-03-01 | Discharge: 2021-03-01 | Disposition: A | Discharge status: Home / Self Care | Payer: 59 | Source: Ambulatory Visit | Attending: Obstetrics and Gynecology | Admitting: Obstetrics and Gynecology

## 2021-03-01 ENCOUNTER — Other Ambulatory Visit: Payer: Self-pay

## 2021-03-01 ENCOUNTER — Encounter: Payer: Self-pay | Admitting: Obstetrics and Gynecology

## 2021-03-01 ENCOUNTER — Ambulatory Visit (INDEPENDENT_AMBULATORY_CARE_PROVIDER_SITE_OTHER): Payer: 59 | Admitting: Obstetrics and Gynecology

## 2021-03-01 VITALS — BP 106/70 | Ht 64.5 in | Wt 190.0 lb

## 2021-03-01 DIAGNOSIS — R69 Illness, unspecified: Secondary | ICD-10-CM | POA: Diagnosis not present

## 2021-03-01 DIAGNOSIS — Z1151 Encounter for screening for human papillomavirus (HPV): Secondary | ICD-10-CM | POA: Diagnosis present

## 2021-03-01 DIAGNOSIS — Z1211 Encounter for screening for malignant neoplasm of colon: Secondary | ICD-10-CM | POA: Diagnosis not present

## 2021-03-01 DIAGNOSIS — Z124 Encounter for screening for malignant neoplasm of cervix: Secondary | ICD-10-CM | POA: Diagnosis not present

## 2021-03-01 DIAGNOSIS — Z1231 Encounter for screening mammogram for malignant neoplasm of breast: Secondary | ICD-10-CM | POA: Diagnosis not present

## 2021-03-01 DIAGNOSIS — Z800 Family history of malignant neoplasm of digestive organs: Secondary | ICD-10-CM | POA: Diagnosis not present

## 2021-03-01 DIAGNOSIS — Z01419 Encounter for gynecological examination (general) (routine) without abnormal findings: Secondary | ICD-10-CM | POA: Diagnosis not present

## 2021-03-01 DIAGNOSIS — N920 Excessive and frequent menstruation with regular cycle: Secondary | ICD-10-CM | POA: Diagnosis not present

## 2021-03-01 LAB — POCT HEMOGLOBIN: Hemoglobin: 15.1 g/dL — AB (ref 11–14.6)

## 2021-03-01 NOTE — Patient Instructions (Signed)
I value your feedback and you entrusting us with your care. If you get a [facility] patient survey, I would appreciate you taking the time to let us know about your experience today. Thank you!  [facility]: [phone]      

## 2021-03-03 LAB — CYTOLOGY - PAP
Comment: NEGATIVE
Diagnosis: NEGATIVE
High risk HPV: NEGATIVE

## 2021-03-09 ENCOUNTER — Other Ambulatory Visit: Payer: Self-pay

## 2021-03-09 DIAGNOSIS — Z1211 Encounter for screening for malignant neoplasm of colon: Secondary | ICD-10-CM

## 2021-03-09 MED ORDER — NA SULFATE-K SULFATE-MG SULF 17.5-3.13-1.6 GM/177ML PO SOLN: 1.0000 | Freq: Once | ORAL | 0 refills | Status: AC

## 2021-03-09 NOTE — Progress Notes (Signed)
Gastroenterology Pre-Procedure Review  Request Date: 05/31/2021 Requesting Physician: Dr. Allegra Lai  PATIENT REVIEW QUESTIONS: The patient responded to the following health history questions as indicated:    1. Are you having any GI issues? no 2. Do you have a personal history of Polyps? no 3. Do you have a family history of Colon Cancer or Polyps? yes (Mother and Sister- polyps) 4. Diabetes Mellitus? no 5. Joint replacements in the past 12 months?no 6. Major health problems in the past 3 months?no 7. Any artificial heart valves, MVP, or defibrillator?no    MEDICATIONS & ALLERGIES:    Patient reports the following regarding taking any anticoagulation/antiplatelet therapy:   Plavix, Coumadin, Eliquis, Xarelto, Lovenox, Pradaxa, Brilinta, or Effient? no Aspirin? no  Patient confirms/reports the following medications:  No current outpatient medications on file.   No current facility-administered medications for this visit.    Patient confirms/reports the following allergies:  No Known Allergies  No orders of the defined types were placed in this encounter.   AUTHORIZATION INFORMATION Primary Insurance: 1D#: Group #:  Secondary Insurance: 1D#: Group #:  SCHEDULE INFORMATION: Date: 05/31/2021 Time: Location: [facility]

## 2021-04-01 ENCOUNTER — Telehealth: Payer: Self-pay | Admitting: Obstetrics and Gynecology

## 2021-04-01 ENCOUNTER — Other Ambulatory Visit: Payer: Self-pay

## 2021-04-01 ENCOUNTER — Other Ambulatory Visit: Payer: Self-pay | Admitting: Obstetrics and Gynecology

## 2021-04-01 ENCOUNTER — Ambulatory Visit (INDEPENDENT_AMBULATORY_CARE_PROVIDER_SITE_OTHER): Payer: 59

## 2021-04-01 DIAGNOSIS — N920 Excessive and frequent menstruation with regular cycle: Secondary | ICD-10-CM

## 2021-04-01 MED ORDER — TRANEXAMIC ACID 650 MG PO TABS: 1300.0000 mg | ORAL_TABLET | Freq: Three times a day (TID) | ORAL | 11 refills | Status: DC

## 2021-04-01 NOTE — Telephone Encounter (Signed)
Pt aware of neg GYN u/s results for menorrhagia. Discussed hormones, lysteda, IuD, ablation. Pt prefers not to have hormones. Had normal HgB 1/23. Will try lysteda, Rx eRxd. F/u prn.   Meds ordered this encounter  Medications   tranexamic acid (LYSTEDA) 650 MG TABS tablet    Sig: Take 2 tablets (1,300 mg total) by mouth 3 (three) times daily. Take during menses for a maximum of five days    Dispense:  30 tablet    Refill:  11    Order Specific Question:   Supervising Provider    Answer:   Gae Dry [phone]

## 2021-04-02 ENCOUNTER — Ambulatory Visit
Admission: RE | Admit: 2021-04-02 | Discharge: 2021-04-02 | Disposition: A | Discharge status: Home / Self Care | Payer: 59 | Source: Ambulatory Visit | Attending: Obstetrics and Gynecology | Admitting: Obstetrics and Gynecology

## 2021-04-02 DIAGNOSIS — Z1231 Encounter for screening mammogram for malignant neoplasm of breast: Secondary | ICD-10-CM | POA: Diagnosis not present

## 2021-04-06 ENCOUNTER — Ambulatory Visit: Payer: 59 | Admitting: Obstetrics & Gynecology

## 2021-04-07 ENCOUNTER — Encounter: Payer: Self-pay | Admitting: Obstetrics and Gynecology

## 2021-04-07 DIAGNOSIS — N920 Excessive and frequent menstruation with regular cycle: Secondary | ICD-10-CM

## 2021-04-07 MED ORDER — TRANEXAMIC ACID 650 MG PO TABS: 1300.0000 mg | ORAL_TABLET | Freq: Three times a day (TID) | ORAL | 11 refills | Status: DC

## 2021-04-07 NOTE — Telephone Encounter (Signed)
You can resend lysteda to [facility]. Thx.

## 2021-04-22 NOTE — Progress Notes (Signed)
I,Sulibeya S Dimas,acting as a Neurosurgeon for Shirlee Latch, MD.,have documented all relevant documentation on the behalf of Shirlee Latch, MD,as directed by  Shirlee Latch, MD while in the presence of Shirlee Latch, MD.  Complete physical exam   Patient: Tammy Marshall   DOB: 1975/05/19   46 y.o. Female  MRN: [id number] Visit Date: 04/23/2021  Today's healthcare provider: Shirlee Latch, MD   Chief Complaint  Patient presents with   Annual Exam   Subjective    Shellyann Veenstra is a 46 y.o. female who presents today for a complete physical exam.  She reports consuming a general diet. The patient does not participate in regular exercise at present. She generally feels fairly well. She reports sleeping poorly. She does have additional problems to discuss today.  HPI  Patient reports she lost her job a few days ago and is very stressed and has not been able to sleep in the last five days. Had depression symptoms before losing her job that have been exacerbated.  Took Klonopin in the past.  Single mom of a 44 y/o son. Worried about paying her bills.  Past Medical History:  Diagnosis Date   Abnormal Pap smear of cervix    Anxiety    Arthritis    Asthma    Chlamydia 01/2016   Depression    HSV-2 seropositive 2021   Hypercholesterolemia    Hypertension    Ovarian cyst    Overactive bladder    Past Surgical History:  Procedure Laterality Date   DILATION AND CURETTAGE OF UTERUS  1995   TUBAL LIGATION     Social History   Socioeconomic History   Marital status: Single    Spouse name: Not on file   Number of children: 2   Years of education: Not on file   Highest education level: Not on file  Occupational History   Not on file  Tobacco Use   Smoking status: Former    Types: Cigarettes    Quit date: 03/22/2018    Years since quitting: 3.0   Smokeless tobacco: Never  Vaping Use   Vaping Use: Never used  Substance and Sexual Activity   Alcohol use: Yes     Comment: 1-4/wk   Drug use: No   Sexual activity: Yes    Birth control/protection: Surgical    Comment: tubal ligation  Other Topics Concern   Not on file  Social History Narrative   Not on file   Social Determinants of Health   Financial Resource Strain: Not on file  Food Insecurity: Not on file  Transportation Needs: Not on file  Physical Activity: Not on file  Stress: Not on file  Social Connections: Not on file  Intimate Partner Violence: Not on file   Family Status  Relation Name Status   Mother  Alive   Father  Alive   MGM  Alive   MGF  Deceased   PGM  Deceased   PGF  (Not Specified)   Pat Aunt  Deceased   Oneal Grout  (Not Specified)   Family History  Problem Relation Age of Onset   Arthritis Mother    Asthma Mother    Cancer Mother        cervical   Hyperlipidemia Father    Hypertension Father    Heart attack Father        old/healed   Colon cancer Father 27   Arthritis Maternal Grandmother    Bipolar disorder Maternal Grandmother  Brain cancer Maternal Grandfather 48   Arthritis Paternal Grandmother    Cancer Paternal Grandmother        unknown   Cancer Paternal Grandfather        brain   Brain cancer Paternal Aunt 68   No Known Allergies  Patient Care Team: Jacky Kindle, FNP as PCP - General (Family Medicine)   Medications: Outpatient Medications Prior to Visit  Medication Sig   tranexamic acid (LYSTEDA) 650 MG TABS tablet Take 2 tablets (1,300 mg total) by mouth 3 (three) times daily. Take during menses for a maximum of five days   No facility-administered medications prior to visit.    Review of Systems  Genitourinary:  Positive for vaginal bleeding.  Musculoskeletal:  Positive for arthralgias.  Psychiatric/Behavioral:  Positive for decreased concentration and sleep disturbance. The patient is nervous/anxious.   All other systems reviewed and are negative.  Last CBC Lab Results  Component Value Date   WBC 5.9 04/15/2019   HGB  15.1 (A) 03/01/2021   HCT 40.2 04/15/2019   MCV 88 04/15/2019   MCH 29.8 04/15/2019   RDW 12.7 04/15/2019   PLT 318 04/15/2019   Last metabolic panel Lab Results  Component Value Date   GLUCOSE 86 04/15/2019   NA 139 04/15/2019   K 4.3 04/15/2019   CL 104 04/15/2019   BUN 7 04/15/2019   CREATININE 0.85 04/15/2019   GFRNONAA 84 04/15/2019   CALCIUM 9.2 04/15/2019   PROT 6.8 04/15/2019   ALBUMIN 4.3 04/15/2019   LABGLOB 2.5 04/15/2019   AGRATIO 1.7 04/15/2019   BILITOT <0.2 04/15/2019   ALKPHOS 70 04/15/2019   AST 15 04/15/2019   Last lipids Lab Results  Component Value Date   CHOL 196 04/15/2019   HDL 42 04/15/2019   LDLCALC 129 (H) 04/15/2019   TRIG 139 04/15/2019   CHOLHDL 4.7 (H) 04/15/2019   Last hemoglobin A1c Lab Results  Component Value Date   HGBA1C 5.3 04/15/2019   Last thyroid functions Lab Results  Component Value Date   TSH 2.250 04/15/2019   Last vitamin D Lab Results  Component Value Date   VD25OH 25.5 (L) 04/15/2019       Objective    BP 109/76 (BP Location: Left Arm, Patient Position: Sitting, Cuff Size: Large)    Pulse 89    Temp 98.2 F (36.8 C) (Temporal)    Resp 16    Ht 5\' 4"  (1.626 m)    Wt 183 lb 3.2 oz (83.1 kg)    LMP 04/07/2021 (Approximate)    BMI 31.45 kg/m  BP Readings from Last 3 Encounters:  04/23/21 109/76  03/01/21 106/70  12/27/19 121/72   Wt Readings from Last 3 Encounters:  04/23/21 183 lb 3.2 oz (83.1 kg)  03/01/21 190 lb (86.2 kg)  12/27/19 184 lb (83.5 kg)       Physical Exam Vitals reviewed.  Constitutional:      General: She is not in acute distress.    Appearance: Normal appearance. She is well-developed. She is not diaphoretic.  HENT:     Head: Normocephalic and atraumatic.     Right Ear: Tympanic membrane, ear canal and external ear normal.     Left Ear: Tympanic membrane, ear canal and external ear normal.     Nose: Nose normal.     Mouth/Throat:     Mouth: Mucous membranes are moist.      Pharynx: Oropharynx is clear. No oropharyngeal exudate.  Eyes:  General: No scleral icterus.    Conjunctiva/sclera: Conjunctivae normal.     Pupils: Pupils are equal, round, and reactive to light.  Neck:     Thyroid: No thyromegaly.  Cardiovascular:     Rate and Rhythm: Normal rate and regular rhythm.     Pulses: Normal pulses.     Heart sounds: Normal heart sounds. No murmur heard. Pulmonary:     Effort: Pulmonary effort is normal. No respiratory distress.     Breath sounds: Normal breath sounds. No wheezing or rales.  Abdominal:     General: There is no distension.     Palpations: Abdomen is soft.     Tenderness: There is no abdominal tenderness.  Musculoskeletal:        General: No deformity.     Cervical back: Neck supple.     Right lower leg: No edema.     Left lower leg: No edema.  Lymphadenopathy:     Cervical: No cervical adenopathy.  Skin:    General: Skin is warm and dry.     Findings: No rash.  Neurological:     Mental Status: She is alert and oriented to person, place, and time. Mental status is at baseline.     Sensory: No sensory deficit.     Motor: No weakness.     Gait: Gait normal.  Psychiatric:        Mood and Affect: Mood is depressed. Affect is flat.        Behavior: Behavior normal.        Thought Content: Thought content normal.      Last depression screening scores PHQ 2/9 Scores 04/23/2021 04/15/2019  PHQ - 2 Score 6 4  PHQ- 9 Score 20 15   Last fall risk screening Fall Risk  04/23/2021  Falls in the past year? 0  Number falls in past yr: 0  Injury with Fall? 0  Risk for fall due to : No Fall Risks  Follow up Falls evaluation completed   Last Audit-C alcohol use screening Alcohol Use Disorder Test (AUDIT) 04/23/2021  1. How often do you have a drink containing alcohol? 2  2. How many drinks containing alcohol do you have on a typical day when you are drinking? 0  3. How often do you have six or more drinks on one occasion? 0  AUDIT-C Score  2   A score of 3 or more in women, and 4 or more in men indicates increased risk for alcohol abuse, EXCEPT if all of the points are from question 1   No results found for any visits on 04/23/21.  Assessment & Plan    Routine Health Maintenance and Physical Exam  Exercise Activities and Dietary recommendations  Goals   None     Immunization History  Administered Date(s) Administered   Moderna Sars-Covid-2 Vaccination 10/19/2019, 11/16/2019   Rabies, IM 11/24/2010, 11/27/2010, 12/01/2010, 12/08/2010, 12/22/2010   Tdap 11/24/2010    Health Maintenance  Topic Date Due   COLONOSCOPY (Pts 45-74yrs Insurance coverage will need to be confirmed)  Never done   COVID-19 Vaccine (3 - Booster for Moderna series) 01/11/2020   TETANUS/TDAP  11/23/2020   INFLUENZA VACCINE  05/21/2021 (Originally 09/21/2020)   PAP SMEAR-Modifier  03/01/2024   Hepatitis C Screening  Completed   HIV Screening  Completed   HPV VACCINES  Aged Out    Discussed health benefits of physical activity, and encouraged her to engage in regular exercise appropriate for her age and condition.  Problem List Items Addressed This Visit       Other   Vitamin D deficiency   Relevant Orders   VITAMIN D 25 Hydroxy (Vit-D Deficiency, Fractures)   Adjustment disorder with mixed anxiety and depressed mood    New problem after new stressor of job loss Will Start Zolfot 50 mg daily Discussed potential side effects, incl GI upset and SI Discussed that it can take 6-8 weeks to reach full efficacy Contracted for safety - no SI/HI Discussed synergistic effects of medications and therapy       Class 1 obesity without serious comorbidity with body mass index (BMI) of 31.0 to 31.9 in adult    Discussed importance of healthy weight management Discussed diet and exercise       Psychophysiological insomnia    Associated with adjustment disorder in the setting of recent job loss Trial of trazodone prn for sleep      Other  Visit Diagnoses     Encounter for annual health examination    -  Primary   Relevant Orders   Lipid Panel With LDL/HDL Ratio   Comprehensive metabolic panel        Return in about 6 weeks (around 06/04/2021) for MDD/GAD f/u, virtual ok.     I, Shirlee LatchAngela Masha Orbach, MD, have reviewed all documentation for this visit. The documentation on 04/23/21 for the exam, diagnosis, procedures, and orders are all accurate and complete.   Kearie Mennen, Marzella SchleinAngela M, MD, MPH [facility]

## 2021-04-23 ENCOUNTER — Ambulatory Visit (INDEPENDENT_AMBULATORY_CARE_PROVIDER_SITE_OTHER): Payer: 59 | Admitting: Family Medicine

## 2021-04-23 ENCOUNTER — Encounter: Payer: Self-pay | Admitting: Family Medicine

## 2021-04-23 ENCOUNTER — Other Ambulatory Visit: Payer: Self-pay

## 2021-04-23 VITALS — BP 109/76 | HR 89 | Temp 98.2°F | Resp 16 | Ht 64.0 in | Wt 183.2 lb

## 2021-04-23 DIAGNOSIS — Z6831 Body mass index (BMI) 31.0-31.9, adult: Secondary | ICD-10-CM | POA: Diagnosis not present

## 2021-04-23 DIAGNOSIS — R69 Illness, unspecified: Secondary | ICD-10-CM | POA: Diagnosis not present

## 2021-04-23 DIAGNOSIS — E559 Vitamin D deficiency, unspecified: Secondary | ICD-10-CM

## 2021-04-23 DIAGNOSIS — Z0000 Encounter for general adult medical examination without abnormal findings: Secondary | ICD-10-CM | POA: Diagnosis not present

## 2021-04-23 DIAGNOSIS — E669 Obesity, unspecified: Secondary | ICD-10-CM | POA: Insufficient documentation

## 2021-04-23 DIAGNOSIS — F4323 Adjustment disorder with mixed anxiety and depressed mood: Secondary | ICD-10-CM | POA: Insufficient documentation

## 2021-04-23 DIAGNOSIS — E6609 Other obesity due to excess calories: Secondary | ICD-10-CM | POA: Insufficient documentation

## 2021-04-23 DIAGNOSIS — F5104 Psychophysiologic insomnia: Secondary | ICD-10-CM | POA: Insufficient documentation

## 2021-04-23 MED ORDER — SERTRALINE HCL 50 MG PO TABS: 50.0000 mg | ORAL_TABLET | Freq: Every day | ORAL | 3 refills | Status: DC

## 2021-04-23 MED ORDER — TRAZODONE HCL 50 MG PO TABS: 25.0000 mg | ORAL_TABLET | Freq: Every evening | ORAL | 3 refills | Status: DC | PRN

## 2021-04-23 NOTE — Assessment & Plan Note (Signed)
Discussed importance of healthy weight management Discussed diet and exercise  

## 2021-04-23 NOTE — Assessment & Plan Note (Signed)
Associated with adjustment disorder in the setting of recent job loss ?Trial of trazodone prn for sleep ?

## 2021-04-23 NOTE — Assessment & Plan Note (Signed)
New problem after new stressor of job loss ?Will Start Zolfot 50 mg daily ?Discussed potential side effects, incl GI upset and SI ?Discussed that it can take 6-8 weeks to reach full efficacy ?Contracted for safety - no SI/HI ?Discussed synergistic effects of medications and therapy  ?

## 2021-04-24 LAB — LIPID PANEL WITH LDL/HDL RATIO
Cholesterol, Total: 244 mg/dL — ABNORMAL HIGH (ref 100–199)
HDL: 44 mg/dL (ref >39)
LDL Chol Calc (NIH): 158 mg/dL — ABNORMAL HIGH (ref 0–99)
LDL/HDL Ratio: 3.6 ratio — ABNORMAL HIGH (ref 0.0–3.2)
Triglycerides: 230 mg/dL — ABNORMAL HIGH (ref 0–149)
VLDL Cholesterol Cal: 42 mg/dL — ABNORMAL HIGH (ref 5–40)

## 2021-04-24 LAB — COMPREHENSIVE METABOLIC PANEL
ALT: 19 IU/L (ref 0–32)
AST: 19 IU/L (ref 0–40)
Albumin/Globulin Ratio: 1.9 (ref 1.2–2.2)
Albumin: 4.8 g/dL (ref 3.8–4.8)
Alkaline Phosphatase: 81 IU/L (ref 44–121)
BUN/Creatinine Ratio: 11 (ref 9–23)
BUN: 11 mg/dL (ref 6–24)
Bilirubin Total: 0.5 mg/dL (ref 0.0–1.2)
CO2: 23 mmol/L (ref 20–29)
Calcium: 10 mg/dL (ref 8.7–10.2)
Chloride: 105 mmol/L (ref 96–106)
Creatinine, Ser: 0.98 mg/dL (ref 0.57–1.00)
Globulin, Total: 2.5 g/dL (ref 1.5–4.5)
Glucose: 97 mg/dL (ref 70–99)
Potassium: 4.7 mmol/L (ref 3.5–5.2)
Sodium: 143 mmol/L (ref 134–144)
Total Protein: 7.3 g/dL (ref 6.0–8.5)
eGFR: 72 mL/min/{1.73_m2} (ref >59)

## 2021-04-24 LAB — VITAMIN D 25 HYDROXY (VIT D DEFICIENCY, FRACTURES): Vit D, 25-Hydroxy: 51 ng/mL (ref 30.0–100.0)

## 2021-05-10 ENCOUNTER — Telehealth: Payer: Self-pay | Admitting: Gastroenterology

## 2021-05-10 NOTE — Telephone Encounter (Signed)
Pt left message to cancel colonoscopy because of work she will call back to resched ?

## 2021-05-11 ENCOUNTER — Other Ambulatory Visit: Payer: Self-pay

## 2021-05-11 NOTE — Progress Notes (Signed)
ERROR

## 2021-05-11 NOTE — Telephone Encounter (Signed)
Procedure has been cancelled per patient request. Endo unit has been notified of change. °

## 2021-05-31 ENCOUNTER — Ambulatory Visit: Admit: 2021-05-31 | Payer: 59 | Admitting: Gastroenterology

## 2021-05-31 SURGERY — COLONOSCOPY WITH PROPOFOL
Anesthesia: General

## 2021-06-04 ENCOUNTER — Telehealth: Payer: 59 | Admitting: Family Medicine

## 2021-06-10 ENCOUNTER — Telehealth (INDEPENDENT_AMBULATORY_CARE_PROVIDER_SITE_OTHER): Payer: 59 | Admitting: Family Medicine

## 2021-06-10 ENCOUNTER — Encounter: Payer: Self-pay | Admitting: Family Medicine

## 2021-06-10 DIAGNOSIS — F5104 Psychophysiologic insomnia: Secondary | ICD-10-CM | POA: Diagnosis not present

## 2021-06-10 DIAGNOSIS — F4323 Adjustment disorder with mixed anxiety and depressed mood: Secondary | ICD-10-CM

## 2021-06-10 DIAGNOSIS — R69 Illness, unspecified: Secondary | ICD-10-CM | POA: Diagnosis not present

## 2021-06-10 DIAGNOSIS — Z6831 Body mass index (BMI) 31.0-31.9, adult: Secondary | ICD-10-CM | POA: Diagnosis not present

## 2021-06-10 DIAGNOSIS — E669 Obesity, unspecified: Secondary | ICD-10-CM

## 2021-06-10 MED ORDER — PHENTERMINE HCL 37.5 MG PO TABS: 37.5000 mg | ORAL_TABLET | Freq: Every day | ORAL | 0 refills | Status: DC

## 2021-06-10 NOTE — Progress Notes (Signed)
? ? ?MyChart Video Visit ? ? ? ?Virtual Visit via Video Note  ? ?This visit type was conducted due to national recommendations for restrictions regarding the COVID-19 Pandemic (e.g. social distancing) in an effort to limit this patient's exposure and mitigate transmission in our community. This patient is at least at moderate risk for complications without adequate follow up. This format is felt to be most appropriate for this patient at this time. Physical exam was limited by quality of the video and audio technology used for the visit.  ? ?Patient location: car, not driving- on lunch break from work ?Provider location:  ?[facility] ?[address]  ?[address] ?[place], [place] [address] ? ? ?I discussed the limitations of evaluation and management by telemedicine and the availability of in person appointments. The patient expressed understanding and agreed to proceed. ? ?Patient: Tammy Marshall   DOB: 05/01/1975   46 y.o. Female  MRN: [id number] ?Visit Date: 06/10/2021 ? ?Today's healthcare provider: Jacky Kindle, FNP  ? ?Introduced to Publishing rights manager role and practice setting.  All questions answered.  Discussed provider/patient relationship and expectations. ? ?Subjective  ?  ?HPI  ?Depression, Follow-up ? ?She  was last seen for this on 04/23/2021. ?Changes made at last visit include Starting Zolfot 50 mg daily. ?  ?She reports good compliance with treatment. ?She is not having side effects. ? ?She reports excellent tolerance of treatment. ?Current symptoms include: depressed mood ?She feels she is Improved since last visit. ? ? ?  06/10/2021  ? 12:55 PM 04/23/2021  ? 10:44 AM 04/15/2019  ?  8:40 AM  ?Depression screen PHQ 2/9  ?Decreased Interest 1 3 2   ?Down, Depressed, Hopeless 1 3 2   ?PHQ - 2 Score 2 6 4   ?Altered sleeping 0 3 2  ?Tired, decreased energy 2 3 3   ?Change in appetite 0 3 2  ?Feeling bad or failure about yourself  0 2 2  ?Trouble concentrating 2 3 2   ?Moving slowly or  fidgety/restless 0 0   ?Suicidal thoughts 0  0  ?PHQ-9 Score 6 20 15   ?Difficult doing work/chores Not difficult at all Extremely dIfficult Somewhat difficult  ?  ?-----------------------------------------------------------------------------------------  ? ? ?Medications: ?Outpatient Medications Prior to Visit  ?Medication Sig  ? sertraline (ZOLOFT) 50 MG tablet Take 1 tablet (50 mg total) by mouth daily.  ? tranexamic acid (LYSTEDA) 650 MG TABS tablet Take 2 tablets (1,300 mg total) by mouth 3 (three) times daily. Take during menses for a maximum of five days  ? traZODone (DESYREL) 50 MG tablet Take 0.5-1 tablets (25-50 mg total) by mouth at bedtime as needed for sleep.  ? ?No facility-administered medications prior to visit.  ? ? ?Review of Systems ? ?No complaints at this time upon ROS. ? ? Objective  ?  ?There were no vitals taken for this visit. ? ? ?Physical Exam ?Constitutional:   ?   Appearance: Normal appearance.  ?Pulmonary:  ?   Effort: Pulmonary effort is normal.  ?Neurological:  ?   General: No focal deficit present.  ?   Mental Status: She is alert.  ?Psychiatric:     ?   Mood and Affect: Mood normal.     ?   Behavior: Behavior normal.     ?   Thought Content: Thought content normal.     ?   Judgment: Judgment normal.  ?  ? ? ? Assessment & Plan  ?  ? ?Problem List Items Addressed This Visit   ? ?  ?  Other  ? Adjustment disorder with mixed anxiety and depressed mood - Primary  ?  Acute, improved ?PHQ-9 completed; today at 6, previously at 20 ?Patient has been able to start a new position which is also assisting with anxiety and depression ?Will continue zoloft 50 mg QD for 3 months to allow patient to settle into role, and then re-evaluate with OV if she wishes to continue or taper off, as she does not wish to be on the medication long term ?Currently she feels the medication is helping with her symptoms of depression as well as nervousness and generalized anxiety with new job ?She is in her 2nd week  of employment at this time ? ? ?  ?  ? Class 1 obesity without serious comorbidity with body mass index (BMI) of 31.0 to 31.9 in adult  ?  Chronic, stable ?Wishes to reach 150# ?Has been on phentermine prior; with bariatric weight loss clinic. ?Recommend use of "goodrx" to assist with cost without insurance coverage ?Continue to recommend balanced, lower carb meals. Smaller meal size, adding snacks. Choosing water as drink of choice and increasing purposeful exercise. ? ? ?  ?  ? Relevant Medications  ? phentermine (ADIPEX-P) 37.5 MG tablet  ? Psychophysiological insomnia  ?  Acute, improved ?Has not needed to use trazodone 25-50 mg qHS within the past two weeks ?Notes that sleep habits have improved and is doing much better overall without complaints of insomnia  ? ?  ?  ? ? ?Return in about 3 months (around 09/09/2021) for anxiety and depression.  ?  ? ?I discussed the assessment and treatment plan with the patient. The patient was provided an opportunity to ask questions and all were answered. The patient agreed with the plan and demonstrated an understanding of the instructions. ?  ?The patient was advised to call back or seek an in-person evaluation if the symptoms worsen or if the condition fails to improve as anticipated. ? ?I provided 10 minutes of face-to-face time during this encounter discussing and reviewing systemic complaints of adjustment disorder, improving insomnia and obesity concerns. ? ?I, Jacky Kindle, FNP, have reviewed all documentation for this visit. The documentation on 06/10/21 for the exam, diagnosis, procedures, and orders are all accurate and complete. ? ? ?Jacky Kindle, FNP ? Family Practice ?[phone] (phone) ?[phone] (fax) ? ?[facility]   ?

## 2021-06-10 NOTE — Assessment & Plan Note (Signed)
Acute, improved ?Has not needed to use trazodone 25-50 mg qHS within the past two weeks ?Notes that sleep habits have improved and is doing much better overall without complaints of insomnia  ?

## 2021-06-10 NOTE — Assessment & Plan Note (Signed)
Chronic, stable ?Wishes to reach 150# ?Has been on phentermine prior; with bariatric weight loss clinic. ?Recommend use of "goodrx" to assist with cost without insurance coverage ?Continue to recommend balanced, lower carb meals. Smaller meal size, adding snacks. Choosing water as drink of choice and increasing purposeful exercise. ? ?

## 2021-06-10 NOTE — Assessment & Plan Note (Signed)
Acute, improved ?PHQ-9 completed; today at 6, previously at 20 ?Patient has been able to start a new position which is also assisting with anxiety and depression ?Will continue zoloft 50 mg QD for 3 months to allow patient to settle into role, and then re-evaluate with OV if she wishes to continue or taper off, as she does not wish to be on the medication long term ?Currently she feels the medication is helping with her symptoms of depression as well as nervousness and generalized anxiety with new job ?She is in her 2nd week of employment at this time ? ?

## 2021-06-16 ENCOUNTER — Other Ambulatory Visit: Payer: Self-pay | Admitting: Family Medicine

## 2021-06-17 NOTE — Telephone Encounter (Signed)
Rx written for #30 [id number] so ok to fill 1 90 day ?Requested Prescriptions  ?Pending Prescriptions Disp Refills  ?? traZODone (DESYREL) 50 MG tablet [Pharmacy Med Name: TRAZODONE 50 MG TABLET] 90 tablet 0  ?  Sig: TAKE 0.5-1 TABLETS BY MOUTH AT BEDTIME AS NEEDED FOR SLEEP.  ?  ? Psychiatry: Antidepressants - Serotonin Modulator Passed - 06/16/2021  2:32 PM  ?  ?  Passed - Valid encounter within last 6 months  ?  Recent Outpatient Visits   ?      ? 1 week ago Adjustment disorder with mixed anxiety and depressed mood  ? [facility] Tally Joe T, FNP  ? 1 month ago Encounter for annual health examination  ? [facility] [place], Dionne Bucy, MD  ? 1 year ago LLQ pain  ? [place], FNP  ? 2 years ago Encounter for routine adult health examination without abnormal findings  ? [place], FNP  ?  ?  ? ?  ?  ?  ? ? ?

## 2021-08-06 ENCOUNTER — Ambulatory Visit: Payer: 59 | Admitting: Podiatry

## 2021-08-06 DIAGNOSIS — L600 Ingrowing nail: Secondary | ICD-10-CM

## 2021-08-06 MED ORDER — DOXYCYCLINE HYCLATE 100 MG PO TABS: 100.0000 mg | ORAL_TABLET | Freq: Two times a day (BID) | ORAL | 0 refills | Status: DC

## 2021-08-06 MED ORDER — GENTAMICIN SULFATE 0.1 % EX CREA: 1.0000 | TOPICAL_CREAM | Freq: Two times a day (BID) | CUTANEOUS | 1 refills | Status: DC

## 2021-08-06 NOTE — Progress Notes (Signed)
   Subjective: Patient presents today for evaluation of pain to the medial and lateral border bilateral great toes. Patient is concerned for possible ingrown nail.  It is very sensitive to touch.  Patient has tried to manage this for several years on her own with minimal improvement.  She continues to get recurrent ingrown toenails.  She actually went to an urgent care previously which seemed to only help alleviate her symptoms temporarily.  Patient presents today for further treatment and evaluation.  Past Medical History:  Diagnosis Date   Abnormal Pap smear of cervix    Anxiety    Arthritis    Asthma    Chlamydia 01/2016   Depression    HSV-2 seropositive 2021   Hypercholesterolemia    Hypertension    Ovarian cyst    Overactive bladder     Objective:  General: Well developed, nourished, in no acute distress, alert and oriented x3   Dermatology: Skin is warm, dry and supple bilateral.  Medial lateral border bilateral great toes appears to be erythematous with evidence of an ingrowing nail. Pain on palpation noted to the border of the nail fold. The remaining nails appear unremarkable at this time. There are no open sores, lesions.  Vascular: Dorsalis Pedis artery and Posterior Tibial artery pedal pulses palpable. No lower extremity edema noted.   Neruologic: Grossly intact via light touch bilateral.  Musculoskeletal: Muscular strength within normal limits in all groups bilateral. Normal range of motion noted to all pedal and ankle joints.   Assesement: #1 Paronychia with ingrowing nail medial and lateral border bilateral great toes #2 Pain in toe  Plan of Care:  1. Patient evaluated.  2. Discussed treatment alternatives and plan of care. Explained nail avulsion procedure and post procedure course to patient. 3. Patient opted for permanent partial nail avulsion of the ingrown portion of the nail.  4. Prior to procedure, local anesthesia infiltration utilized using 3 ml of a  50:50 mixture of 2% plain lidocaine and 0.5% plain marcaine in a normal hallux block fashion and a betadine prep performed.  5. Partial permanent nail avulsion with chemical matrixectomy performed using 3x30sec applications of phenol followed by alcohol flush.  6. Light dressing applied.  Post care instructions provided 7.  Prescription for gentamicin 2% cream  8.  Prescription for doxycycline 100 mg 2 times daily #20 for prophylaxis  9.  Return to clinic 2 weeks.  Felecia Shelling, DPM [facility]  Dr. Felecia Shelling, DPM    [address] [place], [place] [address]                Office [phone]  Fax [phone]

## 2021-08-30 ENCOUNTER — Ambulatory Visit: Payer: 59 | Admitting: Podiatry

## 2021-08-30 DIAGNOSIS — L600 Ingrowing nail: Secondary | ICD-10-CM | POA: Diagnosis not present

## 2021-08-30 NOTE — Progress Notes (Signed)
  Subjective:  Patient ID: Michon Kaczmarek, female    DOB: 04/04/1975,  MRN: [id number]  Chief Complaint  Patient presents with   Ingrown Toenail     follow up 3 weeks nail check left    46 y.o. female presents with the above complaint. History confirmed with patient.  She returns for follow-up after her procedure with Dr. Logan Bores.  She noticed quite a bit of blistering and thinks she may have had an allergic reaction to the ointment.  Objective:  Physical Exam: warm, good capillary refill, no trophic changes or ulcerative lesions, normal DP and PT pulses, normal sensory exam, and matricectomy sites are healing there is peeling skin around both hallux nails, no evidence of infection purulence malodor, erythema and hyperpigmentation around the proximal nail fold.  Assessment:   1. Ingrown toenail of left foot   2. Ingrown toenail of right foot      Plan:  Patient was evaluated and treated and all questions answered.  Suspect she likely has had a reaction to the phenolic acid.  I advised her she can begin to leave the toes open to air is much as possible this point she seems to be past the worst of this.  There were no evidence of active infection currently.  She will return as needed if they do not improve.  Return if symptoms worsen or fail to improve.

## 2021-09-10 ENCOUNTER — Other Ambulatory Visit: Payer: Self-pay | Admitting: Family Medicine

## 2021-09-13 NOTE — Progress Notes (Unsigned)
I,Roshena L Chambers,acting as a scribe for Jacky Kindle, FNP.,have documented all relevant documentation on the behalf of Jacky Kindle, FNP,as directed by  Jacky Kindle, FNP while in the presence of Jacky Kindle, FNP.   Established patient visit   Patient: Tammy Marshall   DOB: [birth year]   46 y.o. Female  MRN: [id number] Visit Date: 09/14/2021  Today's healthcare provider: Jacky Kindle, FNP  Re Introduced to nurse practitioner role and practice setting.  All questions answered.  Discussed provider/patient relationship and expectations.   Chief Complaint  Patient presents with   Depression   Obesity   Subjective    HPI  Depression, Follow-up  She  was last seen for this 3 months ago. From that visit it was noted that patient should continue zoloft 50 mg QD for 3 months to allow time to settle into role. Will re-evaluate with OV if she wishes to continue or taper off, as she did not wish to be on the medication long term.   She reports good compliance with treatment. She is not having side effects.   She reports good tolerance of treatment. She feels she is Improved since last visit.     09/14/2021    9:13 AM 06/10/2021   12:55 PM 04/23/2021   10:44 AM  Depression screen PHQ 2/9  Decreased Interest 0 1 3  Down, Depressed, Hopeless 1 1 3   PHQ - 2 Score 1 2 6   Altered sleeping 1 0 3  Tired, decreased energy 2 2 3   Change in appetite 1 0 3  Feeling bad or failure about yourself  1 0 2  Trouble concentrating 1 2 3   Moving slowly or fidgety/restless 1 0 0  Suicidal thoughts 0 0   PHQ-9 Score 8 6 20   Difficult doing work/chores Somewhat difficult Not difficult at all Extremely dIfficult    -----------------------------------------------------------------------------------------   Follow up for obesity:  The patient was last seen for this 3 months ago. Changes made at last visit include prescribing phentermine 37.5mg  daily. Recommend use of "goodrx" to assist  with cost without insurance coverage Continue to recommend balanced, lower carb meals. Smaller meal size, adding snacks. Choosing water as drink of choice and increasing purposeful exercise. She reports good compliance with treatment. She feels that condition is Improved. She is not having side effects.   -----------------------------------------------------------------------------------------   Medications: Outpatient Medications Prior to Visit  Medication Sig   sertraline (ZOLOFT) 50 MG tablet TAKE 1 TABLET BY MOUTH EVERY DAY   tranexamic acid (LYSTEDA) 650 MG TABS tablet Take 2 tablets (1,300 mg total) by mouth 3 (three) times daily. Take during menses for a maximum of five days   [DISCONTINUED] doxycycline (VIBRA-TABS) 100 MG tablet Take 1 tablet (100 mg total) by mouth 2 (two) times daily.   [DISCONTINUED] gentamicin cream (GARAMYCIN) 0.1 % Apply 1 Application topically 2 (two) times daily.   [DISCONTINUED] phentermine (ADIPEX-P) 37.5 MG tablet Take 1 tablet (37.5 mg total) by mouth daily before breakfast.   [DISCONTINUED] traZODone (DESYREL) 50 MG tablet TAKE 1/2 TO 1 TABLET BY MOUTH AT BEDTIME AS NEEDED FOR SLEEP   No facility-administered medications prior to visit.    Review of Systems     Objective    BP 116/81 (BP Location: Right Arm, Patient Position: Sitting, Cuff Size: Large)   Pulse (!) 104   Temp 98.4 F (36.9 C) (Oral)   Resp 14   Wt 177 lb (80.3 kg)  LMP  (Within Weeks)   SpO2 100%   BMI 30.38 kg/m    Physical Exam Vitals and nursing note reviewed.  Constitutional:      General: She is not in acute distress.    Appearance: Normal appearance. She is obese. She is not ill-appearing, toxic-appearing or diaphoretic.  HENT:     Head: Normocephalic and atraumatic.  Cardiovascular:     Rate and Rhythm: Regular rhythm. Tachycardia present.     Pulses: Normal pulses.     Heart sounds: Normal heart sounds. No murmur heard.    No friction rub. No gallop.   Pulmonary:     Effort: Pulmonary effort is normal. No respiratory distress.     Breath sounds: Normal breath sounds. No stridor. No wheezing, rhonchi or rales.  Chest:     Chest wall: No tenderness.  Abdominal:     General: Bowel sounds are normal.     Palpations: Abdomen is soft.  Musculoskeletal:        General: No swelling, tenderness, deformity or signs of injury. Normal range of motion.     Right lower leg: No edema.     Left lower leg: No edema.  Skin:    General: Skin is warm and dry.     Capillary Refill: Capillary refill takes less than 2 seconds.     Coloration: Skin is not jaundiced or pale.     Findings: No bruising, erythema, lesion or rash.  Neurological:     General: No focal deficit present.     Mental Status: She is alert and oriented to person, place, and time. Mental status is at baseline.     Cranial Nerves: No cranial nerve deficit.     Sensory: No sensory deficit.     Motor: No weakness.     Coordination: Coordination normal.  Psychiatric:        Mood and Affect: Mood normal.        Behavior: Behavior normal.        Thought Content: Thought content normal.        Judgment: Judgment normal.      No results found for any visits on 09/14/21.  Assessment & Plan     Problem List Items Addressed This Visit       Other   Adjustment disorder with mixed anxiety and depressed mood - Primary    Chronic, stable Concerns regarding staying on SSRI too long Currently on zoloft 50 mg  Denies any side effects Review of PHQ 9 scoring today, up from 06/10/21 score Believes that medication assists both with anxiety and with depressive symptoms Encouraged continued use of medication as benefit seems to outweigh risk given no side effects Provided education about weaning process once desired Encouraged not to stop abruptly Patient in agreement        Class 1 obesity due to excess calories with serious comorbidity and body mass index (BMI) of 30.0 to 30.9 in  adult    Chronic, improved Body mass index is 30.38 kg/m. Discussed importance of healthy weight management Discussed diet and exercise Slightly tachycardic today Reports satisfied with weight loss and wishes to continue to use medication to assist as she was not able to exercise as she hoped d/t infected toe nails/pain/site complications including blistering Agreement to repeat dose for another 3 months and then follow up Denies insomnia or bounding pulse/irritability/nausea       Relevant Medications   phentermine (ADIPEX-P) 37.5 MG tablet   Psychophysiological insomnia  Chronic, improved No longer needing trazodone 25-50 mg PRN qHS to assist; even with use of phentermine to assist with weight loss behaviors of diet/exercise         Return in about 3 months (around 12/15/2021) for chonic disease management- obesity .      Leilani Merl, FNP, have reviewed all documentation for this visit. The documentation on 09/14/21 for the exam, diagnosis, procedures, and orders are all accurate and complete.    Jacky Kindle, FNP  [facility] [phone] (phone) [phone] (fax)  [facility]

## 2021-09-14 ENCOUNTER — Ambulatory Visit: Payer: 59 | Admitting: Family Medicine

## 2021-09-14 ENCOUNTER — Encounter: Payer: Self-pay | Admitting: Family Medicine

## 2021-09-14 VITALS — BP 116/81 | HR 104 | Temp 98.4°F | Resp 14 | Wt 177.0 lb

## 2021-09-14 DIAGNOSIS — F4323 Adjustment disorder with mixed anxiety and depressed mood: Secondary | ICD-10-CM

## 2021-09-14 DIAGNOSIS — E6609 Other obesity due to excess calories: Secondary | ICD-10-CM

## 2021-09-14 DIAGNOSIS — F5104 Psychophysiologic insomnia: Secondary | ICD-10-CM | POA: Diagnosis not present

## 2021-09-14 DIAGNOSIS — Z6830 Body mass index (BMI) 30.0-30.9, adult: Secondary | ICD-10-CM | POA: Diagnosis not present

## 2021-09-14 DIAGNOSIS — R69 Illness, unspecified: Secondary | ICD-10-CM | POA: Diagnosis not present

## 2021-09-14 MED ORDER — PHENTERMINE HCL 37.5 MG PO TABS: 37.5000 mg | ORAL_TABLET | Freq: Every day | ORAL | 0 refills | Status: DC

## 2021-09-14 NOTE — Assessment & Plan Note (Signed)
Chronic, improved Body mass index is 30.38 kg/m. Discussed importance of healthy weight management Discussed diet and exercise Slightly tachycardic today Reports satisfied with weight loss and wishes to continue to use medication to assist as she was not able to exercise as she hoped d/t infected toe nails/pain/site complications including blistering Agreement to repeat dose for another 3 months and then follow up Denies insomnia or bounding pulse/irritability/nausea

## 2021-09-14 NOTE — Assessment & Plan Note (Addendum)
Chronic, stable Concerns regarding staying on SSRI too long Currently on zoloft 50 mg  Denies any side effects Review of PHQ 9 scoring today, up from 06/10/21 score Believes that medication assists both with anxiety and with depressive symptoms Encouraged continued use of medication as benefit seems to outweigh risk given no side effects Provided education about weaning process once desired Encouraged not to stop abruptly Patient in agreement

## 2021-09-14 NOTE — Assessment & Plan Note (Addendum)
Chronic, improved No longer needing trazodone 25-50 mg PRN qHS to assist; even with use of phentermine to assist with weight loss behaviors of diet/exercise

## 2021-12-27 ENCOUNTER — Other Ambulatory Visit: Payer: Self-pay | Admitting: Family Medicine

## 2022-03-09 NOTE — Progress Notes (Signed)
PCP:  Jacky Kindle, FNP   Chief Complaint  Patient presents with   Gynecologic Exam    No concerns     HPI:      Ms. Tammy Marshall is a 47 y.o. [phone] who LMP was Patient's last menstrual period was 02/24/2022 (approximate)., presents today for her annual examination.  Her menses are regular every 28-30 days, lasting 5-7 days. 2 days are heavy, changing products Q30 min with quarter-sized clots.  Dysmenorrhea mild, improved with NSAIDs. She does not have intermenstrual bleeding. Neg GYN u/s 2/23 for menorrhagia, declined hormones, tried lysteda with sx improvement. Would like Rx RF. Has never done BC/hormones. No hx of HTN, DVTs, migraines with aura. She does have some vasomotor sx.    Sex activity: single partner, contraception - tubal ligation. No pain/bleeding. Last Pap: 03/01/21 Results were: no abnormalities /neg HPV DNA  Hx of STDs: chlamydia in past; pos HSV 2 IgG  Last mammogram: 04/02/21 Results were: normal--routine follow-up in 12 months There is no FH of breast cancer. Mom with breast mass excision in 40s but pt not sure if benign/malignant. Plans to clarify dx. There is no FH of ovarian cancer.  The patient does do self-breast exams.   Tobacco use: quit a few yrs ago  Alcohol use: none No drug use.  Exercise: not active  She does get adequate calcium and Vitamin D in her diet. Not taking Fe supp.  Colonoscopy: never, wasn't covered by insurance when checked last yr; hasn't done cologuard. There is a FH of colon cancer in her dad, now with metastatic bladder cancer. Doesn't qualify for cancer genetic testing.    Labs with PCP  Past Medical History:  Diagnosis Date   Abnormal Pap smear of cervix    Anxiety    Arthritis    Asthma    Chlamydia 01/2016   Depression    HSV-2 seropositive 2021   Hypercholesterolemia    Hypertension    Ovarian cyst    Overactive bladder     Past Surgical History:  Procedure Laterality Date   DILATION AND CURETTAGE OF UTERUS   1995   TUBAL LIGATION      Family History  Problem Relation Age of Onset   Arthritis Mother    Asthma Mother    Cancer Mother        cervical   Hyperlipidemia Father    Hypertension Father    Heart attack Father        old/healed   Colon cancer Father 52   Arthritis Maternal Grandmother    Bipolar disorder Maternal Grandmother    Brain cancer Maternal Grandfather 81   Arthritis Paternal Grandmother    Cancer Paternal Grandmother        unknown   Cancer Paternal Grandfather        brain   Brain cancer Paternal Aunt 27    Social History   Socioeconomic History   Marital status: Single    Spouse name: Not on file   Number of children: 2   Years of education: Not on file   Highest education level: Not on file  Occupational History   Not on file  Tobacco Use   Smoking status: Former    Types: Cigarettes    Quit date: 03/22/2018    Years since quitting: 3.9   Smokeless tobacco: Never  Vaping Use   Vaping Use: Never used  Substance and Sexual Activity   Alcohol use: Yes    Comment: 1-4/wk  Drug use: No   Sexual activity: Yes    Birth control/protection: Surgical    Comment: tubal ligation  Other Topics Concern   Not on file  Social History Narrative   Not on file   Social Determinants of Health   Financial Resource Strain: Not on file  Food Insecurity: Not on file  Transportation Needs: Not on file  Physical Activity: Unknown (02/17/2017)   Exercise Vital Sign    Days of Exercise per Week: 0 days    Minutes of Exercise per Session: Not on file  Stress: Stress Concern Present (02/17/2017)   Justin    Feeling of Stress : Rather much  Social Connections: Moderately Isolated (02/17/2017)   Social Connection and Isolation Panel [NHANES]    Frequency of Communication with Friends and Family: Twice a week    Frequency of Social Gatherings with Friends and Family: Once a week    Attends  Religious Services: Never    Marine scientist or Organizations: No    Attends Archivist Meetings: Never    Marital Status: Divorced  Human resources officer Violence: Unknown (02/17/2017)   Humiliation, Afraid, Rape, and Kick questionnaire    Fear of Current or Ex-Partner: Patient refused    Emotionally Abused: Patient refused    Physically Abused: Patient refused    Sexually Abused: Patient refused    Outpatient Medications Prior to Visit  Medication Sig Dispense Refill   sertraline (ZOLOFT) 50 MG tablet TAKE 1 TABLET BY MOUTH EVERY DAY (Patient taking differently: Patient is currently taking half a tablet every day.) 90 tablet 3   phentermine (ADIPEX-P) 37.5 MG tablet Take 1 tablet (37.5 mg total) by mouth daily before breakfast. 90 tablet 0   tranexamic acid (LYSTEDA) 650 MG TABS tablet Take 2 tablets (1,300 mg total) by mouth 3 (three) times daily. Take during menses for a maximum of five days 30 tablet 11   No facility-administered medications prior to visit.      ROS:  Review of Systems  Constitutional:  Negative for fatigue, fever and unexpected weight change.  Respiratory:  Negative for cough, shortness of breath and wheezing.   Cardiovascular:  Negative for chest pain, palpitations and leg swelling.  Gastrointestinal:  Negative for blood in stool, constipation, diarrhea, nausea and vomiting.  Endocrine: Negative for cold intolerance, heat intolerance and polyuria.  Genitourinary:  Negative for dyspareunia, dysuria, flank pain, frequency, genital sores, hematuria, menstrual problem, pelvic pain, urgency, vaginal bleeding, vaginal discharge and vaginal pain.  Musculoskeletal:  Positive for arthralgias. Negative for back pain, joint swelling and myalgias.  Skin:  Negative for rash.  Neurological:  Negative for dizziness, syncope, light-headedness, numbness and headaches.  Hematological:  Negative for adenopathy.  Psychiatric/Behavioral:  Negative for agitation,  confusion, sleep disturbance and suicidal ideas. The patient is not nervous/anxious.   BREAST: No symptoms   Objective: BP 120/70   Ht 5' 4.5" (1.638 m)   Wt 191 lb (86.6 kg)   LMP 02/24/2022 (Approximate)   BMI 32.28 kg/m    Physical Exam Constitutional:      Appearance: She is well-developed.  Genitourinary:     Vulva normal.     Right Labia: No rash, tenderness or lesions.    Left Labia: No tenderness, lesions or rash.    No vaginal discharge, erythema or tenderness.      Right Adnexa: not tender and no mass present.    Left Adnexa: not tender and no  mass present.    No cervical friability or polyp.     Uterus is not enlarged or tender.  Breasts:    Right: No mass, nipple discharge, skin change or tenderness.     Left: No mass, nipple discharge, skin change or tenderness.  Neck:     Thyroid: No thyromegaly.  Cardiovascular:     Rate and Rhythm: Normal rate and regular rhythm.     Heart sounds: Normal heart sounds. No murmur heard. Pulmonary:     Effort: Pulmonary effort is normal.     Breath sounds: Normal breath sounds.  Abdominal:     Palpations: Abdomen is soft.     Tenderness: There is no abdominal tenderness. There is no guarding or rebound.  Musculoskeletal:        General: Normal range of motion.     Cervical back: Normal range of motion.  Lymphadenopathy:     Cervical: No cervical adenopathy.  Neurological:     General: No focal deficit present.     Mental Status: She is alert and oriented to person, place, and time.     Cranial Nerves: No cranial nerve deficit.  Skin:    General: Skin is warm and dry.  Psychiatric:        Mood and Affect: Mood normal.        Behavior: Behavior normal.        Thought Content: Thought content normal.        Judgment: Judgment normal.  Vitals reviewed.    RESULTS:  Results for orders placed or performed in visit on 03/10/22 (from the past 24 hour(s))  POCT hemoglobin     Status: Normal   Collection Time:  03/10/22 10:28 AM  Result Value Ref Range   Hemoglobin 14.0 11 - 14.6 g/dL    Assessment/Plan: Encounter for annual routine gynecological examination  Encounter for screening mammogram for malignant neoplasm of breast - Plan: MM 3D SCREEN BREAST BILATERAL; pt to schedule mammo  Screening for colon cancer--pt to check with insurance co re: coverage of colonoscopy vs cologuard. Will f/u with decision for ref.   Family history of colon cancer--recommended colonoscopy due to [place]  Menorrhagia with regular cycle - Plan: POCT hemoglobin, tranexamic acid (LYSTEDA) 650 MG TABS tablet; not anemic, Rx RF. F/u prn.   Meds ordered this encounter  Medications   tranexamic acid (LYSTEDA) 650 MG TABS tablet    Sig: Take 2 tablets (1,300 mg total) by mouth 3 (three) times daily. Take during menses for a maximum of five days    Dispense:  30 tablet    Refill:  11    Order Specific Question:   Supervising Provider    Answer:   Renaldo Reel          GYN counsel breast self exam, mammography screening, adequate intake of calcium and vitamin D, diet and exercise     F/U  Return in about 1 year (around 03/11/2023).  Javonte Elenes B. Shawniece Oyola, PA-C 03/10/2022 10:46 AM

## 2022-03-10 ENCOUNTER — Encounter: Payer: Self-pay | Admitting: Obstetrics and Gynecology

## 2022-03-10 ENCOUNTER — Ambulatory Visit (INDEPENDENT_AMBULATORY_CARE_PROVIDER_SITE_OTHER): Payer: 59 | Admitting: Obstetrics and Gynecology

## 2022-03-10 VITALS — BP 120/70 | Ht 64.5 in | Wt 191.0 lb

## 2022-03-10 DIAGNOSIS — Z1211 Encounter for screening for malignant neoplasm of colon: Secondary | ICD-10-CM

## 2022-03-10 DIAGNOSIS — N920 Excessive and frequent menstruation with regular cycle: Secondary | ICD-10-CM

## 2022-03-10 DIAGNOSIS — Z01419 Encounter for gynecological examination (general) (routine) without abnormal findings: Secondary | ICD-10-CM

## 2022-03-10 DIAGNOSIS — Z800 Family history of malignant neoplasm of digestive organs: Secondary | ICD-10-CM | POA: Diagnosis not present

## 2022-03-10 DIAGNOSIS — Z1231 Encounter for screening mammogram for malignant neoplasm of breast: Secondary | ICD-10-CM

## 2022-03-10 LAB — POCT HEMOGLOBIN: Hemoglobin: 14 g/dL (ref 11–14.6)

## 2022-03-10 MED ORDER — TRANEXAMIC ACID 650 MG PO TABS: 1300.0000 mg | ORAL_TABLET | Freq: Three times a day (TID) | ORAL | 11 refills | Status: DC

## 2022-03-10 NOTE — Patient Instructions (Addendum)
I value your feedback and you entrusting us with your care. If you get a [facility] patient survey, I would appreciate you taking the time to let us know about your experience today. Thank you!  [facility]: [phone]      

## 2022-03-29 ENCOUNTER — Other Ambulatory Visit: Payer: Self-pay | Admitting: Family Medicine

## 2022-04-01 ENCOUNTER — Ambulatory Visit (INDEPENDENT_AMBULATORY_CARE_PROVIDER_SITE_OTHER): Payer: 59 | Admitting: Family Medicine

## 2022-04-01 ENCOUNTER — Encounter: Payer: Self-pay | Admitting: Family Medicine

## 2022-04-01 VITALS — BP 120/72 | HR 102 | Temp 98.8°F | Ht 64.0 in | Wt 191.0 lb

## 2022-04-01 DIAGNOSIS — R0981 Nasal congestion: Secondary | ICD-10-CM

## 2022-04-01 DIAGNOSIS — J0140 Acute pansinusitis, unspecified: Secondary | ICD-10-CM

## 2022-04-01 LAB — POCT RAPID STREP A (OFFICE): Rapid Strep A Screen: NEGATIVE

## 2022-04-01 LAB — POC COVID19 BINAXNOW: SARS Coronavirus 2 Ag: NEGATIVE

## 2022-04-01 MED ORDER — AZITHROMYCIN 500 MG PO TABS: 500.0000 mg | ORAL_TABLET | Freq: Every day | ORAL | 0 refills | Status: DC

## 2022-04-01 NOTE — Progress Notes (Signed)
Established patient visit  Patient: Tammy Marshall   DOB: 1975/12/04   47 y.o. Female  MRN: [id number] Visit Date: 04/01/2022  Today's healthcare provider: Gwyneth Sprout, FNP  Introduced to nurse practitioner role and practice setting.  All questions answered.  Discussed provider/patient relationship and expectations.  Subjective    HPI HPI   Pt stated--chills, fever 101.7, body ache, congested--3 days Last edited by [name], CMA on 04/01/2022 10:55 AM.      Medications: Outpatient Medications Prior to Visit  Medication Sig   sertraline (ZOLOFT) 50 MG tablet TAKE 1 TABLET BY MOUTH EVERY DAY (Patient taking differently: Patient is currently taking half a tablet every day.)   tranexamic acid (LYSTEDA) 650 MG TABS tablet Take 2 tablets (1,300 mg total) by mouth 3 (three) times daily. Take during menses for a maximum of five days   No facility-administered medications prior to visit.    Review of Systems    Objective    BP 120/72 (BP Location: Left Arm, Patient Position: Sitting, Cuff Size: Normal)   Pulse (!) 102   Temp 98.8 F (37.1 C)   Ht 5' 4"$  (1.626 m)   Wt 191 lb (86.6 kg)   LMP 02/24/2022 (Approximate)   SpO2 98%   BMI 32.79 kg/m   Physical Exam Vitals and nursing note reviewed.  Constitutional:      General: She is not in acute distress.    Appearance: Normal appearance. She is obese. She is ill-appearing. She is not toxic-appearing or diaphoretic.  HENT:     Head: Normocephalic and atraumatic.     Right Ear: Tympanic membrane, ear canal and external ear normal.     Left Ear: Tympanic membrane, ear canal and external ear normal.     Nose: Congestion present.     Mouth/Throat:     Mouth: Mucous membranes are moist.     Pharynx: Oropharynx is clear. Posterior oropharyngeal erythema present. No oropharyngeal exudate.  Eyes:     Extraocular Movements: Extraocular movements intact.     Conjunctiva/sclera: Conjunctivae normal.     Pupils: Pupils are equal,  round, and reactive to light.  Cardiovascular:     Rate and Rhythm: Regular rhythm. Tachycardia present.     Pulses: Normal pulses.     Heart sounds: Normal heart sounds. No murmur heard.    No friction rub. No gallop.  Pulmonary:     Effort: Pulmonary effort is normal. No respiratory distress.     Breath sounds: Normal breath sounds. No stridor. No wheezing, rhonchi or rales.  Chest:     Chest wall: No tenderness.  Musculoskeletal:        General: No swelling, tenderness, deformity or signs of injury. Normal range of motion.     Right lower leg: No edema.     Left lower leg: No edema.  Skin:    General: Skin is warm and dry.     Capillary Refill: Capillary refill takes less than 2 seconds.     Coloration: Skin is not jaundiced or pale.     Findings: No bruising, erythema, lesion or rash.  Neurological:     General: No focal deficit present.     Mental Status: She is alert and oriented to person, place, and time. Mental status is at baseline.     Cranial Nerves: No cranial nerve deficit.     Sensory: No sensory deficit.     Motor: No weakness.     Coordination: Coordination normal.  Psychiatric:        Mood and Affect: Mood normal.        Behavior: Behavior normal.        Thought Content: Thought content normal.        Judgment: Judgment normal.    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 Fevers noted with slight tachycardia; continue to monitor PO intake Recommend ABX if symptoms do not improve giving timing of appt       Relevant Medications   azithromycin (ZITHROMAX) 500 MG tablet   Other Relevant Orders   POC COVID-19 (Completed)   POCT rapid strep A (Completed)   Congestion of nasal sinus    Acute, self limiting Possible exposures Has been using vicks nasal spray and otc throat spray, plus NSAIDs/APAP to control fevers POC negative for COVID and strep; pt declines repeat swab for send off Flu. Would not want antivirals      Relevant Orders   POC COVID-19 (Completed)   POCT rapid strep A (Completed)   Return if symptoms worsen or fail to improve.     Vonna Kotyk, FNP, have reviewed all documentation for this visit. The documentation on 04/01/22 for the exam, diagnosis, procedures, and orders are all accurate and complete.  Gwyneth Sprout, [place] [phone] (phone) [phone] (fax)  [place]

## 2022-04-01 NOTE — Assessment & Plan Note (Signed)
Acute, self limiting Possible exposures Has been using vicks nasal spray and otc throat spray, plus NSAIDs/APAP to control fevers POC negative for COVID and strep; pt declines repeat swab for send off Flu. Would not want antivirals

## 2022-04-01 NOTE — Assessment & Plan Note (Signed)
Sinus symptoms x 3 days; works in a [employer] administrator Exposed to a variety of individuals; no one has noted being sick recently. Fevers noted with slight tachycardia; continue to monitor PO intake Recommend ABX if symptoms do not improve giving timing of appt

## 2022-04-01 NOTE — Patient Instructions (Signed)
Some things that can make you feel better are: - Increased rest - Increasing Fluids - Acetaminophen / ibuprofen as needed for fever/pain.  - Salt water gargling, chloraseptic spray and throat lozenges - OTC pseudoephedrine.  - Mucinex.  - Saline sinus flushes or a neti pot.  - Humidifying the air.  

## 2022-04-07 ENCOUNTER — Encounter: Payer: Self-pay | Admitting: Family Medicine

## 2022-04-07 ENCOUNTER — Other Ambulatory Visit: Payer: Self-pay | Admitting: Family Medicine

## 2022-04-07 MED ORDER — FLUCONAZOLE 150 MG PO TABS: 150.0000 mg | ORAL_TABLET | Freq: Once | ORAL | 0 refills | Status: AC

## 2022-04-22 NOTE — Progress Notes (Unsigned)
Complete physical exam  Patient: Tammy Marshall   DOB: 04-Feb-1976   46 y.o. Female  MRN: [id number] Visit Date: 04/25/2022  Today's healthcare provider: Gwyneth Sprout, FNP  Re Introduced to nurse practitioner role and practice setting.  All questions answered.  Discussed provider/patient relationship and expectations.  Subjective    [name] is a 47 y.o. female who presents today for a complete physical exam.  She reports consuming a general diet. The patient does not participate in regular exercise at present. She generally feels fairly well. She reports sleeping fairly well. She does have additional problems to discuss today.   HPI HPI   Physical--pt have question regarding weight loss. Last edited by [name], CMA on 04/25/2022  9:33 AM.      Past Medical History:  Diagnosis Date   Abnormal Pap smear of cervix    Anxiety    Arthritis    Asthma    Chlamydia 01/2016   Depression    HSV-2 seropositive 2021   Hypercholesterolemia    Hypertension    Ovarian cyst    Overactive bladder    Past Surgical History:  Procedure Laterality Date   DILATION AND CURETTAGE OF UTERUS  1995   TUBAL LIGATION     Social History   Socioeconomic History   Marital status: Single    Spouse name: Not on file   Number of children: 2   Years of education: Not on file   Highest education level: Not on file  Occupational History   Not on file  Tobacco Use   Smoking status: Former    Types: Cigarettes    Quit date: 03/22/2018    Years since quitting: 4.0   Smokeless tobacco: Never  Vaping Use   Vaping Use: Never used  Substance and Sexual Activity   Alcohol use: Yes    Comment: 1-4/wk   Drug use: No   Sexual activity: Yes    Birth control/protection: Surgical    Comment: tubal ligation  Other Topics Concern   Not on file  Social History Narrative   Not on file   Social Determinants of Health   Financial Resource Strain: Not on file  Food Insecurity: Not on file   Transportation Needs: Not on file  Physical Activity: Unknown (02/17/2017)   Exercise Vital Sign    Days of Exercise per Week: 0 days    Minutes of Exercise per Session: Not on file  Stress: Stress Concern Present (02/17/2017)   [place]    Feeling of Stress : Rather much  Social Connections: Moderately Isolated (02/17/2017)   Social Connection and Isolation Panel [NHANES]    Frequency of Communication with Friends and Family: Twice a week    Frequency of Social Gatherings with Friends and Family: Once a week    Attends Religious Services: Never    Marine scientist or Organizations: No    Attends Archivist Meetings: Never    Marital Status: Divorced  Human resources officer Violence: Unknown (02/17/2017)   Humiliation, Afraid, Rape, and Kick questionnaire    Fear of Current or Ex-Partner: Patient refused    Emotionally Abused: Patient refused    Physically Abused: Patient refused    Sexually Abused: Patient refused   Family Status  Relation Name Status   Mother  Alive   Father  Alive   Pat Aunt  Deceased   Annamarie Major  (Not Specified)   MGM  Alive  MGF  Deceased   PGM  Deceased   PGF  (Not Specified)   Family History  Problem Relation Age of Onset   Arthritis Mother    Asthma Mother    Cancer Mother        cervical   Hyperlipidemia Father    Hypertension Father    Heart attack Father        old/healed   Colon cancer Father 57   Cancer Father    Brain cancer Paternal Aunt 87   Arthritis Maternal Grandmother    Bipolar disorder Maternal Grandmother    Brain cancer Maternal Grandfather 28   Arthritis Paternal Grandmother    Cancer Paternal Grandmother        unknown   Cancer Paternal Grandfather        brain   No Known Allergies  Patient Care Team: Gwyneth Sprout, FNP as PCP - General (Family Medicine)   Medications: Outpatient Medications Prior to Visit  Medication Sig    sertraline (ZOLOFT) 50 MG tablet TAKE 1 TABLET BY MOUTH EVERY DAY (Patient taking differently: Patient is currently taking half a tablet every day.)   tranexamic acid (LYSTEDA) 650 MG TABS tablet Take 2 tablets (1,300 mg total) by mouth 3 (three) times daily. Take during menses for a maximum of five days (Patient not taking: Reported on 04/25/2022)   [DISCONTINUED] azithromycin (ZITHROMAX) 500 MG tablet Take 1 tablet (500 mg total) by mouth daily.   No facility-administered medications prior to visit.   Review of Systems   Objective    BP 122/73 (BP Location: Right Arm, Patient Position: Sitting, Cuff Size: Normal)   Pulse 80   Temp 97.7 F (36.5 C)   Ht 5' 4.5" (1.638 m)   Wt 191 lb (86.6 kg)   LMP 04/05/2022   SpO2 98%   BMI 32.28 kg/m   Physical Exam Vitals and nursing note reviewed.  Constitutional:      General: She is awake. She is not in acute distress.    Appearance: Normal appearance. She is well-developed and well-groomed. She is obese. She is not ill-appearing, toxic-appearing or diaphoretic.  HENT:     Head: Normocephalic and atraumatic.     Jaw: There is normal jaw occlusion. No trismus, tenderness, swelling or pain on movement.     Right Ear: Hearing, tympanic membrane, ear canal and external ear normal. There is no impacted cerumen.     Left Ear: Hearing, tympanic membrane, ear canal and external ear normal. There is no impacted cerumen.     Nose: Nose normal. No congestion or rhinorrhea.     Right Turbinates: Not enlarged, swollen or pale.     Left Turbinates: Not enlarged, swollen or pale.     Right Sinus: No maxillary sinus tenderness or frontal sinus tenderness.     Left Sinus: No maxillary sinus tenderness or frontal sinus tenderness.     Mouth/Throat:     Lips: Pink.     Mouth: Mucous membranes are moist. No injury.     Tongue: No lesions.     Pharynx: Oropharynx is clear. Uvula midline. No pharyngeal swelling, oropharyngeal exudate, posterior oropharyngeal  erythema or uvula swelling.     Tonsils: No tonsillar exudate or tonsillar abscesses.  Eyes:     General: Lids are normal. Lids are everted, no foreign bodies appreciated. Vision grossly intact. Gaze aligned appropriately. No allergic shiner or visual field deficit.       Right eye: No discharge.  Left eye: No discharge.     Extraocular Movements: Extraocular movements intact.     Conjunctiva/sclera: Conjunctivae normal.     Right eye: Right conjunctiva is not injected. No exudate.    Left eye: Left conjunctiva is not injected. No exudate.    Pupils: Pupils are equal, round, and reactive to light.  Neck:     Thyroid: No thyroid mass, thyromegaly or thyroid tenderness.     Vascular: No carotid bruit.     Trachea: Trachea normal.  Cardiovascular:     Rate and Rhythm: Normal rate and regular rhythm.     Pulses: Normal pulses.          Carotid pulses are 2+ on the right side and 2+ on the left side.      Radial pulses are 2+ on the right side and 2+ on the left side.       Dorsalis pedis pulses are 2+ on the right side and 2+ on the left side.       Posterior tibial pulses are 2+ on the right side and 2+ on the left side.     Heart sounds: Normal heart sounds, S1 normal and S2 normal. No murmur heard.    No friction rub. No gallop.  Pulmonary:     Effort: Pulmonary effort is normal. No respiratory distress.     Breath sounds: Normal breath sounds and air entry. No stridor. No wheezing, rhonchi or rales.  Chest:     Chest wall: No tenderness.  Abdominal:     General: Abdomen is flat. Bowel sounds are normal. There is no distension.     Palpations: Abdomen is soft. There is no mass.     Tenderness: There is no abdominal tenderness. There is no right CVA tenderness, left CVA tenderness, guarding or rebound.     Hernia: No hernia is present.  Genitourinary:    Comments: Exam deferred; denies complaints Musculoskeletal:        General: No swelling, tenderness, deformity or signs of  injury. Normal range of motion.     Cervical back: Full passive range of motion without pain, normal range of motion and neck supple. No edema, rigidity or tenderness. No muscular tenderness.     Right lower leg: No edema.     Left lower leg: No edema.  Lymphadenopathy:     Cervical: No cervical adenopathy.     Right cervical: No superficial, deep or posterior cervical adenopathy.    Left cervical: No superficial, deep or posterior cervical adenopathy.  Skin:    General: Skin is warm and dry.     Capillary Refill: Capillary refill takes less than 2 seconds.     Coloration: Skin is not jaundiced or pale.     Findings: No bruising, erythema, lesion or rash.  Neurological:     General: No focal deficit present.     Mental Status: She is alert and oriented to person, place, and time. Mental status is at baseline.     GCS: GCS eye subscore is 4. GCS verbal subscore is 5. GCS motor subscore is 6.     Sensory: Sensation is intact. No sensory deficit.     Motor: Motor function is intact. No weakness.     Coordination: Coordination is intact. Coordination normal.     Gait: Gait is intact. Gait normal.  Psychiatric:        Attention and Perception: Attention and perception normal.        Mood and Affect: Mood  and affect normal.        Speech: Speech normal.        Behavior: Behavior normal. Behavior is cooperative.        Thought Content: Thought content normal.        Cognition and Memory: Cognition and memory normal.        Judgment: Judgment normal.     Last depression screening scores    04/25/2022    9:38 AM 04/01/2022   11:00 AM 09/14/2021    9:13 AM  PHQ 2/9 Scores  PHQ - 2 Score 2 0 1  PHQ- 9 Score '7 6 8   '$ Last fall risk screening    04/25/2022    9:38 AM  Fall Risk   Falls in the past year? 0  Number falls in past yr: 0  Injury with Fall? 0   Last Audit-C alcohol use screening    04/25/2022    9:38 AM  Alcohol Use Disorder Test (AUDIT)  1. How often do you have a drink  containing alcohol? 2  2. How many drinks containing alcohol do you have on a typical day when you are drinking? 0  3. How often do you have six or more drinks on one occasion? 0  AUDIT-C Score 2   A score of 3 or more in women, and 4 or more in men indicates increased risk for alcohol abuse, EXCEPT if all of the points are from question 1   No results found for any visits on 04/25/22.  Assessment & Plan    Routine Health Maintenance and Physical Exam  Exercise Activities and Dietary recommendations  Goals   None     Immunization History  Administered Date(s) Administered   Moderna Sars-Covid-2 Vaccination 10/19/2019, 11/16/2019   Rabies, IM 11/24/2010, 11/27/2010, 12/01/2010, 12/08/2010, 12/22/2010   Tdap 11/24/2010    Health Maintenance  Topic Date Due   COLONOSCOPY (Pts 45-3yr Insurance coverage will need to be confirmed)  Never done   DTaP/Tdap/Td (2 - Td or Tdap) 11/23/2020   COVID-19 Vaccine (3 - 2023-24 season) 10/22/2021   INFLUENZA VACCINE  05/22/2022 (Originally 09/21/2021)   PAP SMEAR-Modifier  03/01/2024   Hepatitis C Screening  Completed   HIV Screening  Completed   HPV VACCINES  Aged Out    Discussed health benefits of physical activity, and encouraged her to engage in regular exercise appropriate for her age and condition.  Problem List Items Addressed This Visit       Other   Annual physical exam - Primary    UTD on dental, vision, sees GYN for PAP and mammogram requests Due for colon cancer  Things to do to keep yourself healthy  - Exercise at least 30-45 minutes a day, 3-4 days a week.  - Eat a low-fat diet with lots of fruits and vegetables, up to 7-9 servings per day.  - Seatbelts can save your life. Wear them always.  - Smoke detectors on every level of your home, check batteries every year.  - Eye Doctor - have an eye exam every 1-2 years  - Safe sex - if you may be exposed to STDs, use a condom.  - Alcohol -  If you drink, do it moderately,  less than 2 drinks per day.  - [place] Choose someone to speak for you if you are not able.  - Depression is common in our stressful world.If you're feeling down or losing interest in things you normally enjoy,  please come in for a visit.  - Violence - If anyone is threatening or hurting you, please call immediately.       Relevant Orders   Comprehensive metabolic panel   Lipid panel   CBC with Differential/Platelet   TSH + free T4   Avitaminosis D    Chronic, has resumed Vit D supplements per GYN encouragement Repeat labs       Relevant Orders   Vitamin D (25 hydroxy)   Borderline diabetes   Relevant Orders   Hemoglobin A1c   Encounter for weight management   Relevant Medications   phentermine 37.5 MG capsule   Excessive sweating    Will check thyroid given daughter with thyroid issues Pt endorses sweating and weight gain       Relevant Orders   TSH + free T4   Screen for colon cancer   Relevant Orders   Ambulatory referral to Gastroenterology   Vasomotor symptoms due to menopause    Chronic, worsening However, reports stable and she can't manage at this time       Return in about 3 months (around 07/26/2022) for weight mgmt- OK for virtual , chonic disease management.    Vonna Kotyk, FNP, have reviewed all documentation for this visit. The documentation on 04/25/22 for the exam, diagnosis, procedures, and orders are all accurate and complete.  Gwyneth Sprout, [place] [phone] (phone) [phone] (fax)  [place]

## 2022-04-25 ENCOUNTER — Ambulatory Visit (INDEPENDENT_AMBULATORY_CARE_PROVIDER_SITE_OTHER): Payer: 59 | Admitting: Family Medicine

## 2022-04-25 ENCOUNTER — Encounter: Payer: Self-pay | Admitting: Family Medicine

## 2022-04-25 VITALS — BP 122/73 | HR 80 | Temp 97.7°F | Ht 64.5 in | Wt 191.0 lb

## 2022-04-25 DIAGNOSIS — E559 Vitamin D deficiency, unspecified: Secondary | ICD-10-CM

## 2022-04-25 DIAGNOSIS — N951 Menopausal and female climacteric states: Secondary | ICD-10-CM

## 2022-04-25 DIAGNOSIS — Z1329 Encounter for screening for other suspected endocrine disorder: Secondary | ICD-10-CM | POA: Insufficient documentation

## 2022-04-25 DIAGNOSIS — Z0000 Encounter for general adult medical examination without abnormal findings: Secondary | ICD-10-CM | POA: Diagnosis not present

## 2022-04-25 DIAGNOSIS — Z7689 Persons encountering health services in other specified circumstances: Secondary | ICD-10-CM | POA: Diagnosis not present

## 2022-04-25 DIAGNOSIS — R7303 Prediabetes: Secondary | ICD-10-CM | POA: Diagnosis not present

## 2022-04-25 DIAGNOSIS — Z1211 Encounter for screening for malignant neoplasm of colon: Secondary | ICD-10-CM | POA: Diagnosis not present

## 2022-04-25 DIAGNOSIS — Z1231 Encounter for screening mammogram for malignant neoplasm of breast: Secondary | ICD-10-CM | POA: Insufficient documentation

## 2022-04-25 DIAGNOSIS — R61 Generalized hyperhidrosis: Secondary | ICD-10-CM | POA: Diagnosis not present

## 2022-04-25 MED ORDER — PHENTERMINE HCL 37.5 MG PO CAPS: 37.5000 mg | ORAL_CAPSULE | ORAL | 0 refills | Status: DC

## 2022-04-25 NOTE — Assessment & Plan Note (Signed)
UTD on dental, vision, sees GYN for PAP and mammogram requests Due for colon cancer  Things to do to keep yourself healthy  - Exercise at least 30-45 minutes a day, 3-4 days a week.  - Eat a low-fat diet with lots of fruits and vegetables, up to 7-9 servings per day.  - Seatbelts can save your life. Wear them always.  - Smoke detectors on every level of your home, check batteries every year.  - Eye Doctor - have an eye exam every 1-2 years  - Safe sex - if you may be exposed to STDs, use a condom.  - Alcohol -  If you drink, do it moderately, less than 2 drinks per day.  - [place]. Choose someone to speak for you if you are not able.  - Depression is common in our stressful world.If you're feeling down or losing interest in things you normally enjoy, please come in for a visit.  - Violence - If anyone is threatening or hurting you, please call immediately.

## 2022-04-25 NOTE — Assessment & Plan Note (Signed)
Will check thyroid given daughter with thyroid issues Pt endorses sweating and weight gain

## 2022-04-25 NOTE — Assessment & Plan Note (Signed)
Chronic, worsening However, reports stable and she can't manage at this time

## 2022-04-25 NOTE — Assessment & Plan Note (Signed)
Chronic, has resumed Vit D supplements per GYN encouragement Repeat labs

## 2022-04-26 ENCOUNTER — Other Ambulatory Visit: Payer: Self-pay | Admitting: Family Medicine

## 2022-04-26 LAB — COMPREHENSIVE METABOLIC PANEL
ALT: 17 IU/L (ref 0–32)
AST: 16 IU/L (ref 0–40)
Albumin/Globulin Ratio: 1.4 (ref 1.2–2.2)
Albumin: 4.2 g/dL (ref 3.9–4.9)
Alkaline Phosphatase: 80 IU/L (ref 44–121)
BUN/Creatinine Ratio: 9 (ref 9–23)
BUN: 8 mg/dL (ref 6–24)
Bilirubin Total: 0.3 mg/dL (ref 0.0–1.2)
CO2: 22 mmol/L (ref 20–29)
Calcium: 9.9 mg/dL (ref 8.7–10.2)
Chloride: 100 mmol/L (ref 96–106)
Creatinine, Ser: 0.87 mg/dL (ref 0.57–1.00)
Globulin, Total: 2.9 g/dL (ref 1.5–4.5)
Glucose: 96 mg/dL (ref 70–99)
Potassium: 4.6 mmol/L (ref 3.5–5.2)
Sodium: 136 mmol/L (ref 134–144)
Total Protein: 7.1 g/dL (ref 6.0–8.5)
eGFR: 83 mL/min/{1.73_m2} (ref >59)

## 2022-04-26 LAB — VITAMIN D 25 HYDROXY (VIT D DEFICIENCY, FRACTURES): Vit D, 25-Hydroxy: 43.1 ng/mL (ref 30.0–100.0)

## 2022-04-26 LAB — CBC WITH DIFFERENTIAL/PLATELET
Basophils Absolute: 0 10*3/uL (ref 0.0–0.2)
Basos: 1 %
EOS (ABSOLUTE): 0.2 10*3/uL (ref 0.0–0.4)
Eos: 3 %
Hematocrit: 41.4 % (ref 34.0–46.6)
Hemoglobin: 14.1 g/dL (ref 11.1–15.9)
Immature Grans (Abs): 0 10*3/uL (ref 0.0–0.1)
Immature Granulocytes: 0 %
Lymphocytes Absolute: 2.4 10*3/uL (ref 0.7–3.1)
Lymphs: 34 %
MCH: 29.6 pg (ref 26.6–33.0)
MCHC: 34.1 g/dL (ref 31.5–35.7)
MCV: 87 fL (ref 79–97)
Monocytes Absolute: 0.5 10*3/uL (ref 0.1–0.9)
Monocytes: 7 %
Neutrophils Absolute: 4 10*3/uL (ref 1.4–7.0)
Neutrophils: 55 %
Platelets: 378 10*3/uL (ref 150–450)
RBC: 4.76 x10E6/uL (ref 3.77–5.28)
RDW: 12 % (ref 11.7–15.4)
WBC: 7 10*3/uL (ref 3.4–10.8)

## 2022-04-26 LAB — LIPID PANEL
Chol/HDL Ratio: 4.9 ratio — ABNORMAL HIGH (ref 0.0–4.4)
Cholesterol, Total: 228 mg/dL — ABNORMAL HIGH (ref 100–199)
HDL: 47 mg/dL (ref >39)
LDL Chol Calc (NIH): 131 mg/dL — ABNORMAL HIGH (ref 0–99)
Triglycerides: 280 mg/dL — ABNORMAL HIGH (ref 0–149)
VLDL Cholesterol Cal: 50 mg/dL — ABNORMAL HIGH (ref 5–40)

## 2022-04-26 LAB — TSH+FREE T4
Free T4: 0.9 ng/dL (ref 0.82–1.77)
TSH: 3.97 u[IU]/mL (ref 0.450–4.500)

## 2022-04-26 LAB — HEMOGLOBIN A1C
Est. average glucose Bld gHb Est-mCnc: 114 mg/dL
Hgb A1c MFr Bld: 5.6 % (ref 4.8–5.6)

## 2022-04-26 NOTE — Telephone Encounter (Signed)
Unable to refill per protocol, Rx request is too soon..  Requested Prescriptions  Pending Prescriptions Disp Refills   sertraline (ZOLOFT) 50 MG tablet [Pharmacy Med Name: SERTRALINE HCL 50 MG TABLET] 90 tablet 4    Sig: TAKE 1 TABLET BY MOUTH EVERY DAY     Psychiatry:  Antidepressants - SSRI - sertraline Passed - 04/26/2022 12:34 PM      Passed - AST in normal range and within 360 days    AST  Date Value Ref Range Status  04/25/2022 16 0 - 40 IU/L Final         Passed - ALT in normal range and within 360 days    ALT  Date Value Ref Range Status  04/25/2022 17 0 - 32 IU/L Final         Passed - Completed PHQ-2 or PHQ-9 in the last 360 days      Passed - Valid encounter within last 6 months    Recent Outpatient Visits           Yesterday Annual physical exam   [facility] Tally Joe T, FNP   3 weeks ago Acute non-recurrent pansinusitis   [facility] Tally Joe T, FNP   7 months ago Adjustment disorder with mixed anxiety and depressed mood   [facility] Tally Joe T, FNP   10 months ago Adjustment disorder with mixed anxiety and depressed mood   [facility] Tally Joe T, FNP   1 year ago Encounter for annual health examination   [facility], Dionne Bucy, MD

## 2022-04-26 NOTE — Progress Notes (Signed)
Cholesterol remains elevated; with elevated fats as well as bad/LDL cholesterol.  All other labs are normal and stable.  A1c has increased and now is borderline pre-diabetic. Continue to recommend balanced, lower carb meals. Smaller meal size, adding snacks. Choosing water as drink of choice and increasing purposeful exercise.  Please let us know if you have any questions.  Thank you, Gwyneth Sprout, [address] [place], [place] [address] [phone] (phone) [phone] (fax) [place]

## 2022-04-27 ENCOUNTER — Other Ambulatory Visit: Payer: Self-pay | Admitting: Family Medicine

## 2022-04-27 MED ORDER — SERTRALINE HCL 25 MG PO TABS: 25.0000 mg | ORAL_TABLET | Freq: Every day | ORAL | 3 refills | Status: DC

## 2022-04-28 ENCOUNTER — Ambulatory Visit
Admission: RE | Admit: 2022-04-28 | Discharge: 2022-04-28 | Disposition: A | Discharge status: Home / Self Care | Payer: 59 | Source: Ambulatory Visit | Attending: Obstetrics and Gynecology | Admitting: Obstetrics and Gynecology

## 2022-04-28 DIAGNOSIS — Z1231 Encounter for screening mammogram for malignant neoplasm of breast: Secondary | ICD-10-CM | POA: Diagnosis not present

## 2022-04-29 ENCOUNTER — Telehealth: Payer: Self-pay | Admitting: *Deleted

## 2022-04-29 ENCOUNTER — Telehealth: Payer: Self-pay

## 2022-04-29 NOTE — Telephone Encounter (Signed)
Patient called and notified of results: Cholesterol remains elevated; with elevated fats as well as bad/LDL cholesterol.   All other labs are normal and stable.   A1c has increased and now is borderline pre-diabetic. Continue to recommend balanced, lower carb meals. Smaller meal size, adding snacks. Choosing water as drink of choice and increasing purposeful exercise.   Please let us know if you have any questions.

## 2022-04-29 NOTE — Telephone Encounter (Signed)
Contacted patient to schedule her colonoscopy.  She said she will need to look at her work schedule and call back to schedule.  I informed her that we will send her a reminder letter.  Thanks, [place], [place]

## 2022-05-03 ENCOUNTER — Encounter: Payer: Self-pay | Admitting: *Deleted

## 2022-05-31 ENCOUNTER — Telehealth: Payer: Self-pay

## 2022-05-31 ENCOUNTER — Other Ambulatory Visit: Payer: Self-pay

## 2022-05-31 DIAGNOSIS — Z1211 Encounter for screening for malignant neoplasm of colon: Secondary | ICD-10-CM

## 2022-05-31 MED ORDER — NA SULFATE-K SULFATE-MG SULF 17.5-3.13-1.6 GM/177ML PO SOLN: 1.0000 | Freq: Once | ORAL | 0 refills | Status: AC

## 2022-05-31 NOTE — Telephone Encounter (Signed)
Gastroenterology Pre-Procedure Review  Request Date: 07/04/22 Requesting Physician: Dr. Allegra Lai  PATIENT REVIEW QUESTIONS: The patient responded to the following health history questions as indicated:    1. Are you having any GI issues? no 2. Do you have a personal history of Polyps? no 3. Do you have a family history of Colon Cancer or Polyps? yes (mother colon polyps) 4. Diabetes Mellitus? no 5. Joint replacements in the past 12 months?no 6. Major health problems in the past 3 months?no 7. Any artificial heart valves, MVP, or defibrillator?no 8. Takes Phentermine- has been advised to stop 1 day prior to colonoscopy.    MEDICATIONS & ALLERGIES:    Patient reports the following regarding taking any anticoagulation/antiplatelet therapy:   Plavix, Coumadin, Eliquis, Xarelto, Lovenox, Pradaxa, Brilinta, or Effient? no Aspirin? no  Patient confirms/reports the following medications:  Current Outpatient Medications  Medication Sig Dispense Refill   phentermine 37.5 MG capsule Take 1 capsule (37.5 mg total) by mouth every morning. 90 capsule 0   sertraline (ZOLOFT) 25 MG tablet Take 1 tablet (25 mg total) by mouth daily. 90 tablet 3   tranexamic acid (LYSTEDA) 650 MG TABS tablet Take 2 tablets (1,300 mg total) by mouth 3 (three) times daily. Take during menses for a maximum of five days (Patient not taking: Reported on 04/25/2022) 30 tablet 11   No current facility-administered medications for this visit.    Patient confirms/reports the following allergies:  No Known Allergies  No orders of the defined types were placed in this encounter.   AUTHORIZATION INFORMATION Primary Insurance: 1D#: Group #:  Secondary Insurance: 1D#: Group #:  SCHEDULE INFORMATION: Date:  Time: Location:

## 2022-07-04 ENCOUNTER — Encounter
Admission: RE | Disposition: A | Discharge status: Home / Self Care | Payer: Self-pay | Source: Home / Self Care | Attending: Gastroenterology

## 2022-07-04 ENCOUNTER — Ambulatory Visit: Payer: 59 | Admitting: Anesthesiology

## 2022-07-04 ENCOUNTER — Ambulatory Visit
Admission: RE | Admit: 2022-07-04 | Discharge: 2022-07-04 | Disposition: A | Discharge status: Home / Self Care | Payer: 59 | Attending: Gastroenterology | Admitting: Gastroenterology

## 2022-07-04 DIAGNOSIS — F419 Anxiety disorder, unspecified: Secondary | ICD-10-CM | POA: Diagnosis not present

## 2022-07-04 DIAGNOSIS — J45909 Unspecified asthma, uncomplicated: Secondary | ICD-10-CM | POA: Insufficient documentation

## 2022-07-04 DIAGNOSIS — Z7689 Persons encountering health services in other specified circumstances: Secondary | ICD-10-CM

## 2022-07-04 DIAGNOSIS — K644 Residual hemorrhoidal skin tags: Secondary | ICD-10-CM | POA: Diagnosis not present

## 2022-07-04 DIAGNOSIS — Z87891 Personal history of nicotine dependence: Secondary | ICD-10-CM | POA: Diagnosis not present

## 2022-07-04 DIAGNOSIS — M1990 Unspecified osteoarthritis, unspecified site: Secondary | ICD-10-CM | POA: Insufficient documentation

## 2022-07-04 DIAGNOSIS — Z6830 Body mass index (BMI) 30.0-30.9, adult: Secondary | ICD-10-CM | POA: Insufficient documentation

## 2022-07-04 DIAGNOSIS — N3281 Overactive bladder: Secondary | ICD-10-CM | POA: Diagnosis not present

## 2022-07-04 DIAGNOSIS — E669 Obesity, unspecified: Secondary | ICD-10-CM | POA: Insufficient documentation

## 2022-07-04 DIAGNOSIS — I10 Essential (primary) hypertension: Secondary | ICD-10-CM | POA: Diagnosis not present

## 2022-07-04 DIAGNOSIS — Z800 Family history of malignant neoplasm of digestive organs: Secondary | ICD-10-CM | POA: Diagnosis not present

## 2022-07-04 DIAGNOSIS — F32A Depression, unspecified: Secondary | ICD-10-CM | POA: Insufficient documentation

## 2022-07-04 DIAGNOSIS — Z1211 Encounter for screening for malignant neoplasm of colon: Secondary | ICD-10-CM

## 2022-07-04 DIAGNOSIS — K5730 Diverticulosis of large intestine without perforation or abscess without bleeding: Secondary | ICD-10-CM | POA: Insufficient documentation

## 2022-07-04 DIAGNOSIS — E7800 Pure hypercholesterolemia, unspecified: Secondary | ICD-10-CM | POA: Diagnosis not present

## 2022-07-04 HISTORY — PX: COLONOSCOPY WITH PROPOFOL: SHX5780

## 2022-07-04 LAB — POCT PREGNANCY, URINE: Preg Test, Ur: NEGATIVE

## 2022-07-04 SURGERY — COLONOSCOPY WITH PROPOFOL
Anesthesia: General

## 2022-07-04 MED ORDER — SODIUM CHLORIDE 0.9 % IV SOLN
INTRAVENOUS | Status: DC
  Administered 2022-07-04: 20 mL/h via INTRAVENOUS

## 2022-07-04 MED ORDER — LIDOCAINE HCL (CARDIAC) PF 100 MG/5ML IV SOSY
PREFILLED_SYRINGE | INTRAVENOUS | Status: DC | PRN
  Administered 2022-07-04: 40 mg via INTRAVENOUS

## 2022-07-04 MED ORDER — PROPOFOL 10 MG/ML IV BOLUS
INTRAVENOUS | Status: DC | PRN
  Administered 2022-07-04: 90 mg via INTRAVENOUS
  Administered 2022-07-04: 20 mg via INTRAVENOUS
  Administered 2022-07-04: 10 mg via INTRAVENOUS

## 2022-07-04 MED ORDER — PROPOFOL 500 MG/50ML IV EMUL
INTRAVENOUS | Status: DC | PRN
  Administered 2022-07-04: 150 ug/kg/min via INTRAVENOUS

## 2022-07-04 MED ORDER — PROPOFOL 1000 MG/100ML IV EMUL
INTRAVENOUS | Status: AC
  Filled 2022-07-04: qty 100

## 2022-07-04 NOTE — Transfer of Care (Signed)
Immediate Anesthesia Transfer of Care Note  Patient: Tammy Marshall  Procedure(s) Performed: Procedure(s): COLONOSCOPY WITH PROPOFOL (N/A)  Patient Location: PACU and Endoscopy Unit  Anesthesia Type:General  Level of Consciousness: sedated  Airway & Oxygen Therapy: Patient Spontanous Breathing and Patient connected to nasal cannula oxygen  Post-op Assessment: Report given to RN and Post -op Vital signs reviewed and stable  Post vital signs: Reviewed and stable  Last Vitals:  Vitals:   07/04/22 0854 07/04/22 0927  BP: (!) 140/92 92/66  Pulse: (!) 105 95  Resp: 20 17  Temp: (!) 36.2 C (!) 36.1 C  SpO2: 100% 100%    Complications: No apparent anesthesia complications

## 2022-07-04 NOTE — Op Note (Signed)
[facility] Patient Name: Tammy Marshall Procedure Date: 07/04/2022 9:06 AM MRN: [id number] Account #: [id number] Date of Birth: 1975-09-06 Admit Type: Outpatient Age: 47 Room: [facility] ENDO ROOM 4 Gender: Female Note Status: Finalized Instrument Name: Prentice Docker [id number] Procedure:             Colonoscopy Indications:           Screening for colorectal malignant neoplasm, Screening                         patient at increased risk: Family history of                         1st-degree relative with colorectal cancer at age 61                         years (or older), This is the patient's first                         colonoscopy Providers:             Toney Reil MD, MD Referring MD:          Daryl Eastern. Suzie Portela (Referring MD) Medicines:             General Anesthesia Complications:         No immediate complications. Estimated blood loss: None. Procedure:             Pre-Anesthesia Assessment:                        - Prior to the procedure, a History and Physical was                         performed, and patient medications and allergies were                         reviewed. The patient is competent. The risks and                         benefits of the procedure and the sedation options and                         risks were discussed with the patient. All questions                         were answered and informed consent was obtained.                         Patient identification and proposed procedure were                         verified by the physician, the nurse, the                         anesthesiologist, the anesthetist and the technician                         in the pre-procedure area in the procedure room in the  endoscopy suite. Mental Status Examination: alert and                         oriented. Airway Examination: normal oropharyngeal                         airway and neck mobility. Respiratory  Examination:                         clear to auscultation. CV Examination: normal.                         Prophylactic Antibiotics: The patient does not require                         prophylactic antibiotics. Prior Anticoagulants: The                         patient has taken no anticoagulant or antiplatelet                         agents. ASA Grade Assessment: II - A patient with mild                         systemic disease. After reviewing the risks and                         benefits, the patient was deemed in satisfactory                         condition to undergo the procedure. The anesthesia                         plan was to use general anesthesia. Immediately prior                         to administration of medications, the patient was                         re-assessed for adequacy to receive sedatives. The                         heart rate, respiratory rate, oxygen saturations,                         blood pressure, adequacy of pulmonary ventilation, and                         response to care were monitored throughout the                         procedure. The physical status of the patient was                         re-assessed after the procedure.                        After obtaining informed consent, the colonoscope was  passed under direct vision. Throughout the procedure,                         the patient's blood pressure, pulse, and oxygen                         saturations were monitored continuously. The                         Colonoscope was introduced through the anus and                         advanced to the the cecum, identified by appendiceal                         orifice and ileocecal valve. The colonoscopy was                         performed without difficulty. The patient tolerated                         the procedure well. The quality of the bowel                         preparation was evaluated using the BBPS  [facility] Bowel                         Preparation Scale) with scores of: Right Colon = 3,                         Transverse Colon = 3 and Left Colon = 3 (entire mucosa                         seen well with no residual staining, small fragments                         of stool or opaque liquid). The total BBPS score                         equals 9. The ileocecal valve, appendiceal orifice,                         and rectum were photographed. Findings:      Skin tags were found on perianal exam.      Multiple diverticula were found in the recto-sigmoid colon and sigmoid       colon.      The retroflexed view of the distal rectum and anal verge was normal and       showed no anal or rectal abnormalities.      The exam was otherwise without abnormality. Impression:            - Perianal skin tags found on perianal exam.                        - Diverticulosis in the recto-sigmoid colon and in the  sigmoid colon.                        - The distal rectum and anal verge are normal on                         retroflexion view.                        - The examination was otherwise normal.                        - No specimens collected. Recommendation:        - Discharge patient to home (with escort).                        - Resume previous diet today.                        - Continue present medications.                        - Repeat colonoscopy in 5 years for screening purposes. Procedure Code(s):     --- Professional ---                        Z6109, Colorectal cancer screening; colonoscopy on                         individual at high risk Diagnosis Code(s):     --- Professional ---                        Z12.11, Encounter for screening for malignant neoplasm                         of colon                        Z80.0, Family history of malignant neoplasm of                         digestive organs                        K64.4, Residual hemorrhoidal  skin tags                        K57.30, Diverticulosis of large intestine without                         perforation or abscess without bleeding CPT copyright 2022 American Medical Association. All rights reserved. The codes documented in this report are preliminary and upon coder review may  be revised to meet current compliance requirements. Dr. Libby Maw Toney Reil MD, MD 07/04/2022 9:26:19 AM This report has been signed electronically. Number of Addenda: 0 Note Initiated On: 07/04/2022 9:06 AM Scope Withdrawal Time: 0 hours 7 minutes 12 seconds  Total Procedure Duration: 0 hours 11 minutes 11 seconds  Estimated Blood Loss:  Estimated blood loss: none.      [facility]

## 2022-07-04 NOTE — Anesthesia Postprocedure Evaluation (Signed)
Anesthesia Post Note  Patient: Tammy Marshall  Procedure(s) Performed: COLONOSCOPY WITH PROPOFOL  Patient location during evaluation: PACU Anesthesia Type: General Level of consciousness: awake and alert, oriented and patient cooperative Pain management: pain level controlled Vital Signs Assessment: post-procedure vital signs reviewed and stable Respiratory status: spontaneous breathing, nonlabored ventilation and respiratory function stable Cardiovascular status: blood pressure returned to baseline and stable Postop Assessment: adequate PO intake Anesthetic complications: no   No notable events documented.   Last Vitals:  Vitals:   07/04/22 0937 07/04/22 0947  BP: 106/71 104/66  Pulse: 80 75  Resp: 13 14  Temp:    SpO2: 100% 98%    Last Pain:  Vitals:   07/04/22 0947  TempSrc:   PainSc: 0-No pain                 Reed Breech

## 2022-07-04 NOTE — Anesthesia Preprocedure Evaluation (Addendum)
Anesthesia Evaluation  Patient identified by MRN, date of birth, ID band Patient awake    Reviewed: Allergy & Precautions, NPO status , Patient's Chart, lab work & pertinent test results  History of Anesthesia Complications Negative for: history of anesthetic complications  Airway Mallampati: I   Neck ROM: Full    Dental no notable dental hx.    Pulmonary asthma , former smoker (quit 2020)   Pulmonary exam normal breath sounds clear to auscultation       Cardiovascular Exercise Tolerance: Good negative cardio ROS Normal cardiovascular exam Rhythm:Regular Rate:Normal     Neuro/Psych  PSYCHIATRIC DISORDERS Anxiety Depression    negative neurological ROS     GI/Hepatic negative GI ROS,,,  Endo/Other  Prediabetes; obesity  Renal/GU negative Renal ROS     Musculoskeletal  (+) Arthritis ,    Abdominal   Peds  Hematology negative hematology ROS (+)   Anesthesia Other Findings   Reproductive/Obstetrics                             Anesthesia Physical Anesthesia Plan  ASA: 2  Anesthesia Plan: General   Post-op Pain Management:    Induction: Intravenous  PONV Risk Score and Plan: 3 and Propofol infusion, TIVA and Treatment may vary due to age or medical condition  Airway Management Planned: Natural Airway  Additional Equipment:   Intra-op Plan:   Post-operative Plan:   Informed Consent: I have reviewed the patients History and Physical, chart, labs and discussed the procedure including the risks, benefits and alternatives for the proposed anesthesia with the patient or authorized representative who has indicated his/her understanding and acceptance.       Plan Discussed with: CRNA  Anesthesia Plan Comments: (LMA/GETA backup discussed.  Patient consented for risks of anesthesia including but not limited to:  - adverse reactions to medications - damage to eyes, teeth, lips or  other oral mucosa - nerve damage due to positioning  - sore throat or hoarseness - damage to heart, brain, nerves, lungs, other parts of body or loss of life  Informed patient about role of CRNA in peri- and intra-operative care.  Patient voiced understanding.)       Anesthesia Quick Evaluation

## 2022-07-04 NOTE — H&P (Signed)
Arlyss Repress, MD [address]  [address]  [place], [place] [address]  Main: [phone]  Fax: [phone] Pager: [phone]  Primary Care Physician:  Jacky Kindle, FNP Primary Gastroenterologist:  Dr. Arlyss Repress  Pre-Procedure History & Physical: HPI:  Tammy Marshall is a 47 y.o. female is here for an colonoscopy.   Past Medical History:  Diagnosis Date   Abnormal Pap smear of cervix    Anxiety    Arthritis    Asthma    Chlamydia 01/2016   Depression    HSV-2 seropositive 2021   Hypercholesterolemia    Hypertension    Ovarian cyst    Overactive bladder     Past Surgical History:  Procedure Laterality Date   DILATION AND CURETTAGE OF UTERUS  1995   TUBAL LIGATION      Prior to Admission medications   Medication Sig Start Date End Date Taking? Authorizing Provider  phentermine 37.5 MG capsule Take 1 capsule (37.5 mg total) by mouth every morning. 04/25/22   Jacky Kindle, FNP  sertraline (ZOLOFT) 25 MG tablet Take 1 tablet (25 mg total) by mouth daily. 04/27/22   Jacky Kindle, FNP  tranexamic acid (LYSTEDA) 650 MG TABS tablet Take 2 tablets (1,300 mg total) by mouth 3 (three) times daily. Take during menses for a maximum of five days Patient not taking: Reported on 04/25/2022 03/10/22   Copland, Alicia B, PA-C    Allergies as of 06/01/2022   (No Known Allergies)    Family History  Problem Relation Age of Onset   Arthritis Mother    Asthma Mother    Cancer Mother        cervical   Hyperlipidemia Father    Hypertension Father    Heart attack Father        old/healed   Colon cancer Father 56   Cancer Father    Brain cancer Paternal Aunt 15   Arthritis Maternal Grandmother    Bipolar disorder Maternal Grandmother    Brain cancer Maternal Grandfather 79   Arthritis Paternal Grandmother    Cancer Paternal Grandmother        unknown   Cancer Paternal Grandfather        brain   Breast cancer Neg Hx     Social History   Socioeconomic History    Marital status: Single    Spouse name: Not on file   Number of children: 2   Years of education: Not on file   Highest education level: Not on file  Occupational History   Not on file  Tobacco Use   Smoking status: Former    Types: Cigarettes    Quit date: 03/22/2018    Years since quitting: 4.2   Smokeless tobacco: Never  Vaping Use   Vaping Use: Never used  Substance and Sexual Activity   Alcohol use: Yes    Comment: 1-4/wk   Drug use: No   Sexual activity: Yes    Birth control/protection: Surgical    Comment: tubal ligation  Other Topics Concern   Not on file  Social History Narrative   Not on file   Social Determinants of Health   Financial Resource Strain: Not on file  Food Insecurity: Not on file  Transportation Needs: Not on file  Physical Activity: Unknown (02/17/2017)   Exercise Vital Sign    Days of Exercise per Week: 0 days    Minutes of Exercise per Session: Not on file  Stress: Stress Concern  Present (02/17/2017)   [employer] of Occupational Health - Occupational Stress Questionnaire    Feeling of Stress : Rather much  Social Connections: Moderately Isolated (02/17/2017)   Social Connection and Isolation Panel [NHANES]    Frequency of Communication with Friends and Family: Twice a week    Frequency of Social Gatherings with Friends and Family: Once a week    Attends Religious Services: Never    Database administrator or Organizations: No    Attends Banker Meetings: Never    Marital Status: Divorced  Catering manager Violence: Unknown (02/17/2017)   Humiliation, Afraid, Rape, and Kick questionnaire    Fear of Current or Ex-Partner: Patient declined    Emotionally Abused: Patient declined    Physically Abused: Patient declined    Sexually Abused: Patient declined    Review of Systems: See HPI, otherwise negative ROS  Physical Exam: BP (!) 140/92   Pulse (!) 105   Temp (!) 97.1 F (36.2 C) (Temporal)   Resp 20   Ht 5'  4.5" (1.638 m)   Wt 81.9 kg   SpO2 100%   BMI 30.52 kg/m  Vitals:   07/04/22 0854  BP: (!) 140/92  Pulse: (!) 105  Resp: 20  Temp: (!) 97.1 F (36.2 C)  SpO2: 100%    General:   Alert,  pleasant and cooperative in NAD Head:  Normocephalic and atraumatic. Neck:  Supple; no masses or thyromegaly. Lungs:  Clear throughout to auscultation.    Heart:  Regular rate and rhythm. Abdomen:  Soft, nontender and nondistended. Normal bowel sounds, without guarding, and without rebound.   Neurologic:  Alert and  oriented x4;  grossly normal neurologically.  Impression/Plan: Tammy Marshall is here for an colonoscopy to be performed for colon cancer screening  Risks, benefits, limitations, and alternatives regarding  colonoscopy have been reviewed with the patient.  Questions have been answered.  All parties agreeable.   Lannette Donath, MD  07/04/2022, 9:05 AM

## 2022-07-04 NOTE — Anesthesia Procedure Notes (Signed)
Date/Time: 07/04/2022 9:13 AM  Performed by: Stormy Fabian, CRNAPre-anesthesia Checklist: Patient identified, Emergency Drugs available, Suction available and Patient being monitored Patient Re-evaluated:Patient Re-evaluated prior to induction Oxygen Delivery Method: Nasal cannula Induction Type: IV induction Dental Injury: Teeth and Oropharynx as per pre-operative assessment  Comments: Nasal cannula with etCO2 monitoring

## 2022-07-05 ENCOUNTER — Encounter: Payer: Self-pay | Admitting: Gastroenterology

## 2022-12-05 ENCOUNTER — Other Ambulatory Visit: Payer: Self-pay | Admitting: Family Medicine

## 2022-12-05 DIAGNOSIS — Z7689 Persons encountering health services in other specified circumstances: Secondary | ICD-10-CM

## 2023-02-06 IMAGING — MG MM DIGITAL SCREENING BILAT W/ TOMO AND CAD
6 of 10 series · 6 of 30 positions shown · non-contrast
Comparison: Previous exam(s).

CLINICAL DATA: Screening.

EXAM:
DIGITAL SCREENING BILATERAL MAMMOGRAM WITH TOMOSYNTHESIS AND CAD
TECHNIQUE: Bilateral screening digital craniocaudal and mediolateral oblique
mammograms were obtained. Bilateral screening digital breast
tomosynthesis was performed. The images were evaluated with
computer-aided detection.

[R MLO synth-2D (1 of 2)]
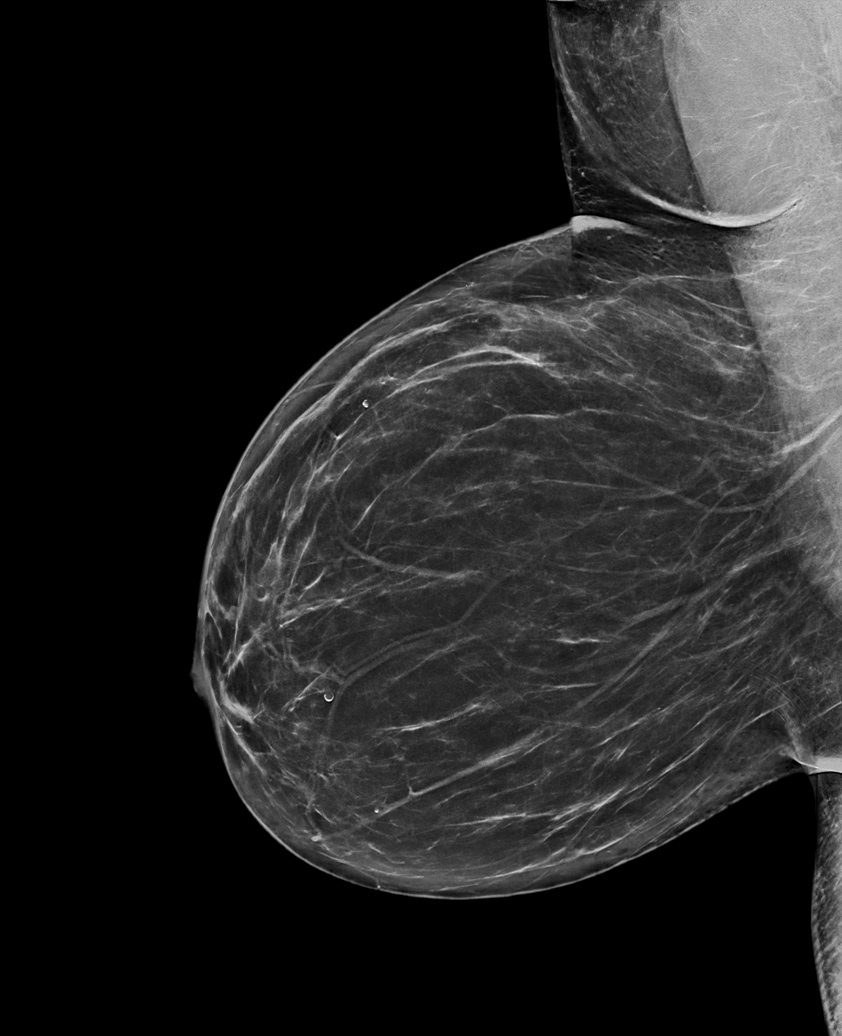

[L CC synth-2D]
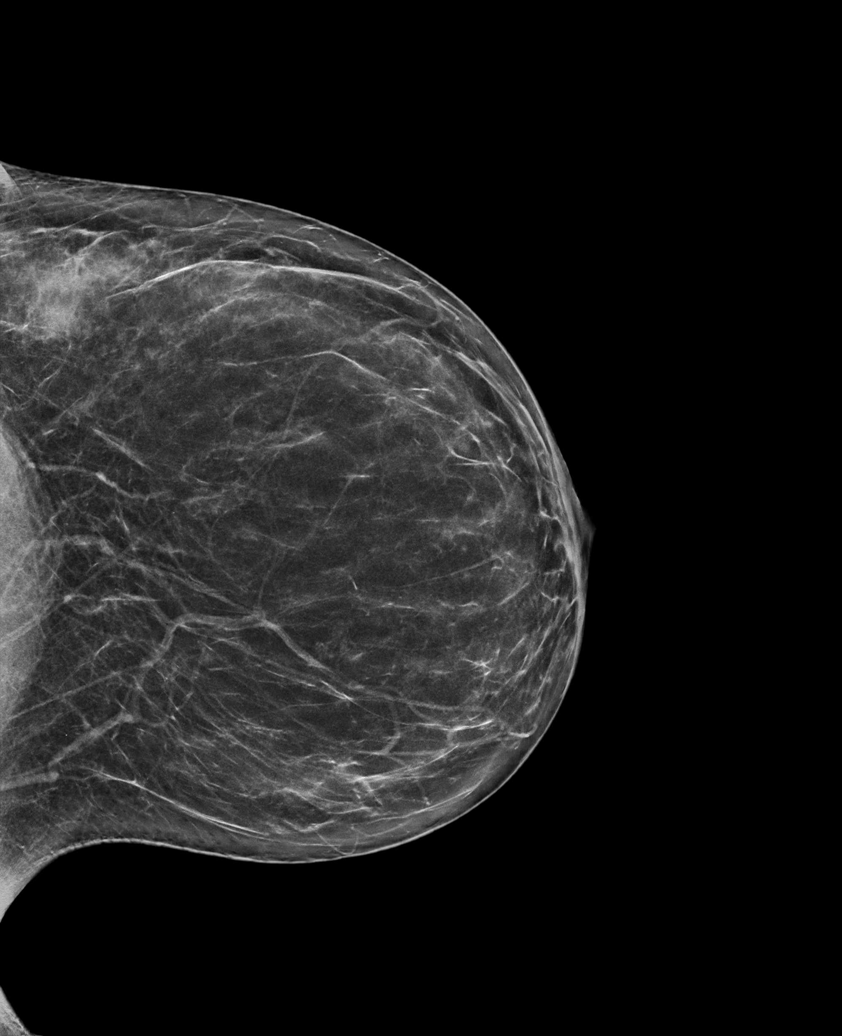

[R MLO synth-2D (2 of 2)]
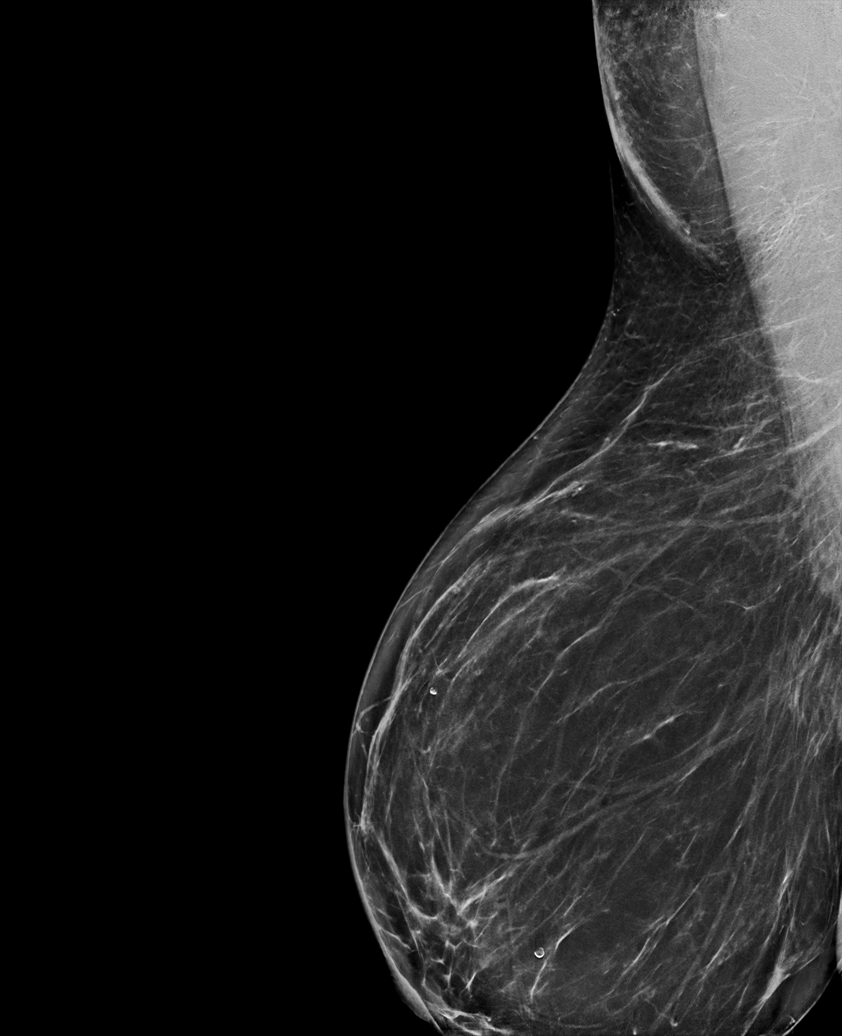

[R CC synth-2D]
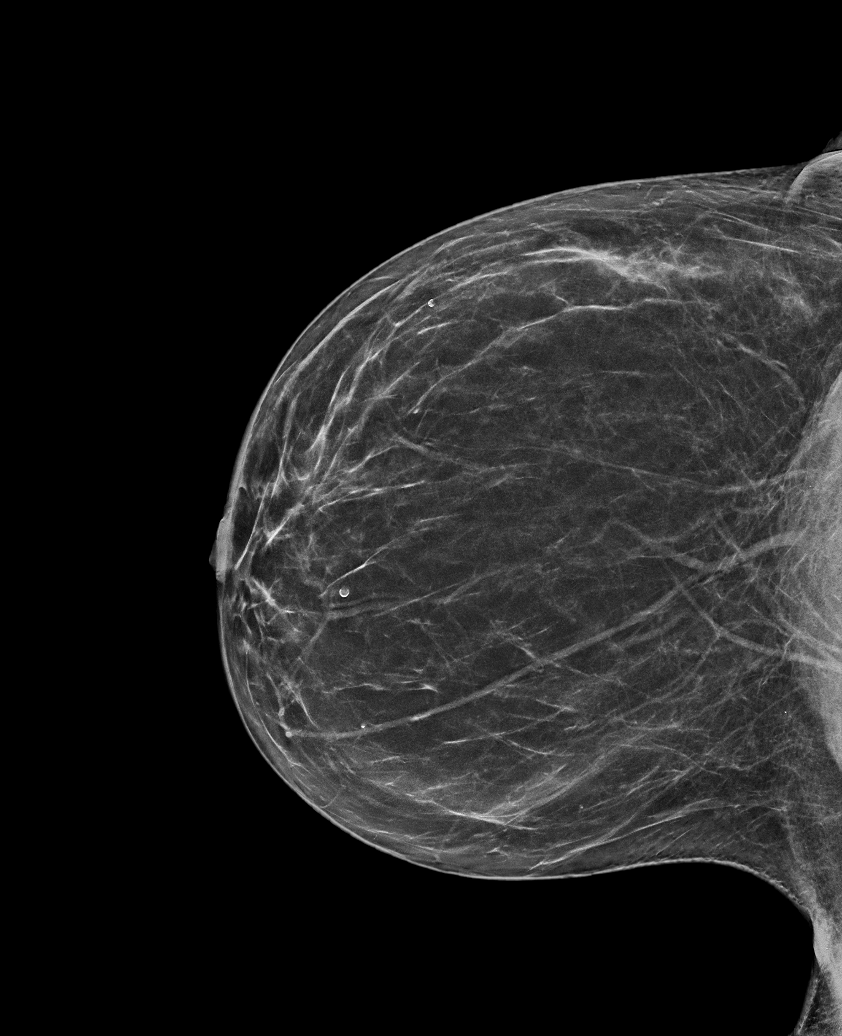

[L MLO synth-2D]
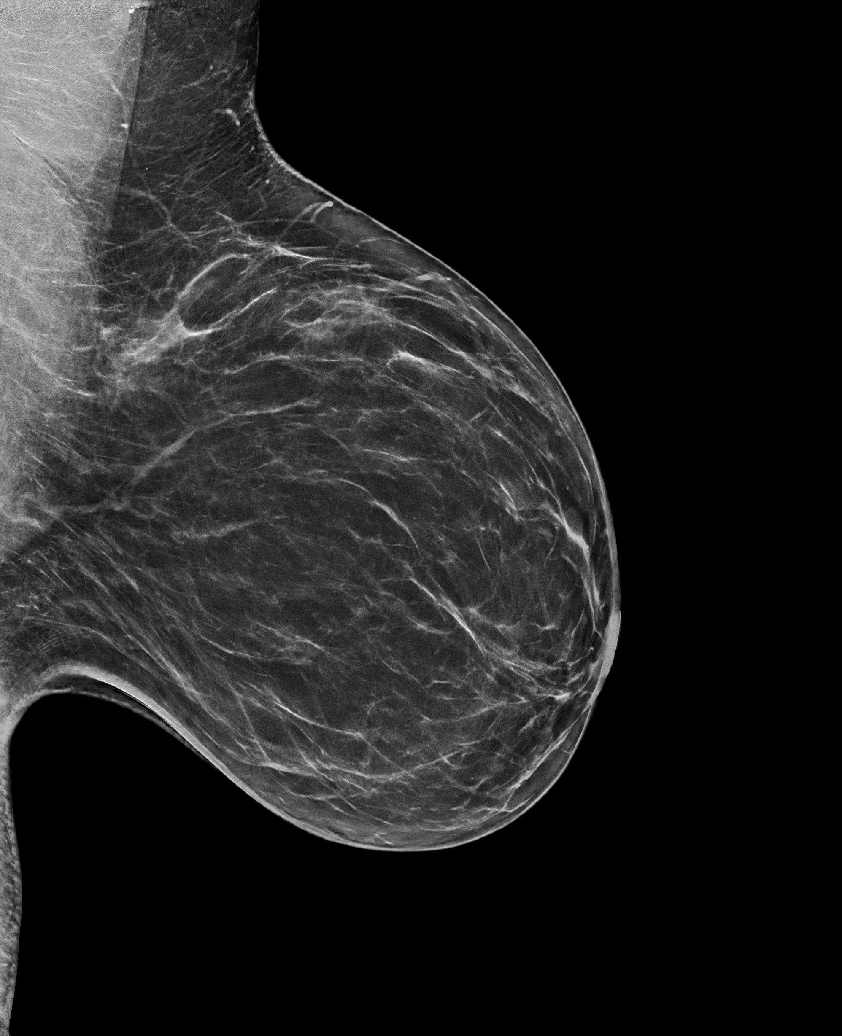

[L CC tomo · tomo slice 35/69.0]
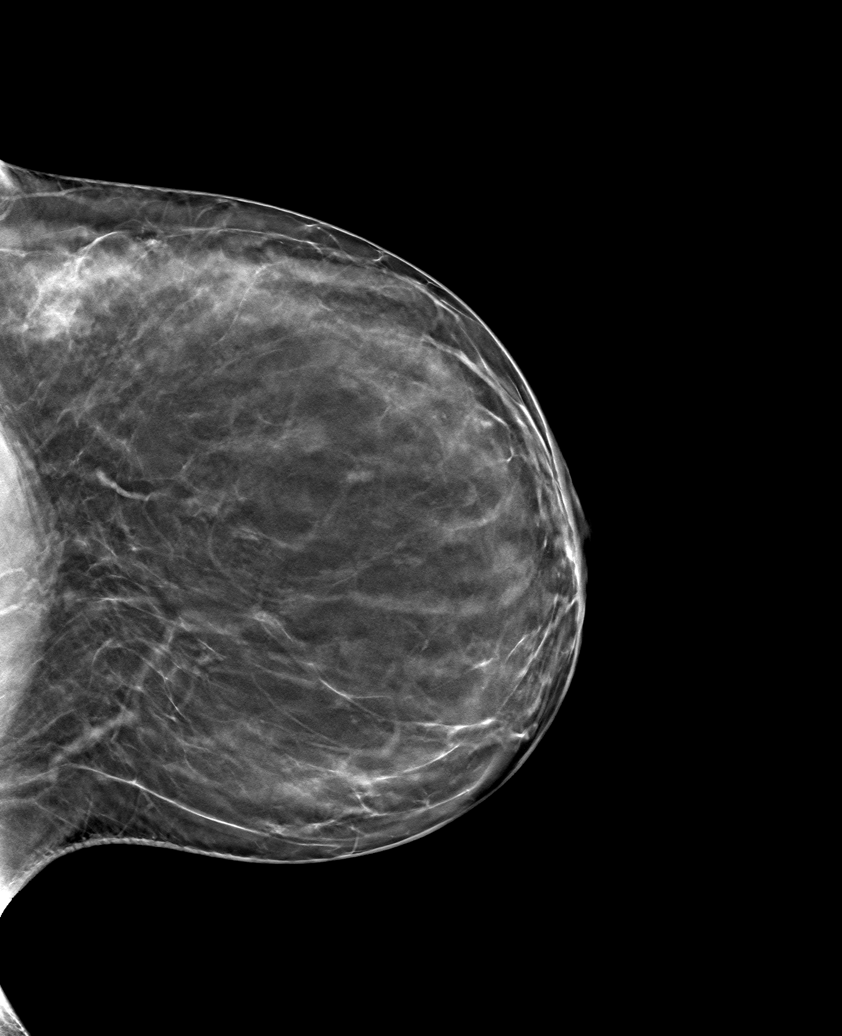

[6 of 30 positions shown; findings below may reference images not displayed]

ACR Breast Density Category b: There are scattered areas of
fibroglandular density.
FINDINGS: There are no findings suspicious for malignancy.
IMPRESSION: No mammographic evidence of malignancy. A result letter of this
screening mammogram will be mailed directly to the patient.

RECOMMENDATION:
Screening mammogram in one year. (Code:51-O-LD2)

BI-RADS CATEGORY  1: Negative.

## 2023-03-20 ENCOUNTER — Ambulatory Visit: Payer: 59 | Admitting: Obstetrics and Gynecology

## 2023-04-12 ENCOUNTER — Ambulatory Visit: Payer: Self-pay | Admitting: Family Medicine

## 2023-04-12 ENCOUNTER — Encounter: Payer: Self-pay | Admitting: Family Medicine

## 2023-04-20 ENCOUNTER — Encounter: Payer: Self-pay | Admitting: Family Medicine

## 2023-04-20 ENCOUNTER — Ambulatory Visit: Payer: BC Managed Care – PPO | Admitting: Family Medicine

## 2023-04-20 VITALS — BP 111/74 | HR 79 | Temp 98.5°F | Resp 18 | Ht 64.5 in | Wt 184.7 lb

## 2023-04-20 DIAGNOSIS — Z1231 Encounter for screening mammogram for malignant neoplasm of breast: Secondary | ICD-10-CM | POA: Diagnosis not present

## 2023-04-20 DIAGNOSIS — R253 Fasciculation: Secondary | ICD-10-CM

## 2023-04-20 DIAGNOSIS — F411 Generalized anxiety disorder: Secondary | ICD-10-CM | POA: Diagnosis not present

## 2023-04-20 DIAGNOSIS — Z7689 Persons encountering health services in other specified circumstances: Secondary | ICD-10-CM

## 2023-04-20 MED ORDER — CYCLOBENZAPRINE HCL 5 MG PO TABS
5.0000 mg | ORAL_TABLET | Freq: Every day | ORAL | 0 refills | Status: AC
Start: 2023-04-20 — End: ?

## 2023-04-20 NOTE — Assessment & Plan Note (Signed)
 Not currently taking medication for anxiety. Will continue to monitor. Gad-7: 5 today.

## 2023-04-20 NOTE — Progress Notes (Signed)
 New Patient Office Visit  Subjective    Patient ID: Tammy Marshall, female    DOB: May 26, 1975  Age: 48 y.o. MRN: [id number]  CC:  Chief Complaint  Patient presents with   Establish Care    Patient is here to establish care with a new PCP, She would like to discuss twitching that she has been experiencing for the past 3 months under her right eye    HPI Tammy Marshall presents to establish care with this practice. She is new to me.  Taking weight loss medications currently. [facility] prescribes oral semiglutide 3mg  daily.  Started this dose on Monday. Has lost 8 pounds total since November. Working on lifestyle changes. This is not managed per primary care.   Facial twitching: started when starting a stressful job in late November. Has had 3 major changes in recent years. Hoping this is related to stress. Has had this before, was on gabapentin in the past, she is not taking this now.  Fatigue lately.     Outpatient Encounter Medications as of 04/20/2023  Medication Sig   cyclobenzaprine (FLEXERIL) 5 MG tablet Take 1 tablet (5 mg total) by mouth at bedtime.   [DISCONTINUED] phentermine 37.5 MG capsule Take 1 capsule (37.5 mg total) by mouth every morning.   [DISCONTINUED] sertraline (ZOLOFT) 25 MG tablet Take 1 tablet (25 mg total) by mouth daily.   No facility-administered encounter medications on file as of 04/20/2023.    Past Medical History:  Diagnosis Date   Abnormal Pap smear of cervix    Anxiety    Arthritis    Asthma    Chlamydia 01/2016   Depression    HSV-2 seropositive 2021   Hypercholesterolemia    Hypertension    Ovarian cyst    Overactive bladder     Past Surgical History:  Procedure Laterality Date   COLONOSCOPY WITH PROPOFOL N/A 07/04/2022   Procedure: COLONOSCOPY WITH PROPOFOL;  Surgeon: Toney Reil, MD;  Location: [facility] ENDOSCOPY;  Service: Gastroenterology;  Laterality: N/A;   DILATION AND CURETTAGE OF UTERUS  1995   TUBAL LIGATION       Family History  Problem Relation Age of Onset   Arthritis Mother    Asthma Mother    Cancer Mother        cervical   Hearing loss Mother    Varicose Veins Mother    Hyperlipidemia Father    Hypertension Father    Heart attack Father        old/healed   Colon cancer Father 83   Cancer Father    Heart disease Father    Brain cancer Paternal Aunt 45   Arthritis Maternal Grandmother    Bipolar disorder Maternal Grandmother    Brain cancer Maternal Grandfather 68   Arthritis Paternal Grandmother    Cancer Paternal Grandmother        unknown   Cancer Paternal Grandfather        brain   Breast cancer Neg Hx     Social History   Socioeconomic History   Marital status: Single    Spouse name: Not on file   Number of children: 2   Years of education: Not on file   Highest education level: 12th grade  Occupational History   Not on file  Tobacco Use   Smoking status: Former    Current packs/day: 0.00    Types: Cigarettes, E-cigarettes    Quit date: 03/22/2018    Years since quitting: 5.0  Passive exposure: Past   Smokeless tobacco: Never   Tobacco comments:    I still vape sometimes  Vaping Use   Vaping status: Never Used  Substance and Sexual Activity   Alcohol use: Not Currently    Comment: Maybe 1 beer once every 6 mths if that   Drug use: No   Sexual activity: Yes    Birth control/protection: Surgical    Comment: tubal ligation  Other Topics Concern   Not on file  Social History Narrative   Not on file   Social Drivers of Health   Financial Resource Strain: Medium Risk (04/18/2023)   Overall Financial Resource Strain (CARDIA)    Difficulty of Paying Living Expenses: Somewhat hard  Food Insecurity: No Food Insecurity (04/18/2023)   Hunger Vital Sign    Worried About Running Out of Food in the Last Year: Never true    Ran Out of Food in the Last Year: Never true  Transportation Needs: No Transportation Needs (04/18/2023)   PRAPARE - Therapist, art (Medical): No    Lack of Transportation (Non-Medical): No  Physical Activity: Insufficiently Active (04/18/2023)   Exercise Vital Sign    Days of Exercise per Week: 1 day    Minutes of Exercise per Session: 10 min  Stress: Stress Concern Present (04/18/2023)   [employer] of Occupational Health - Occupational Stress Questionnaire    Feeling of Stress : Rather much  Social Connections: Unknown (04/18/2023)   Social Connection and Isolation Panel [NHANES]    Frequency of Communication with Friends and Family: More than three times a week    Frequency of Social Gatherings with Friends and Family: Once a week    Attends Religious Services: Never    Database administrator or Organizations: No    Attends Engineer, structural: Not on file    Marital Status: Patient declined  Intimate Partner Violence: Unknown (02/17/2017)   Humiliation, Afraid, Rape, and Kick questionnaire    Fear of Current or Ex-Partner: Patient declined    Emotionally Abused: Patient declined    Physically Abused: Patient declined    Sexually Abused: Patient declined    ROS      Objective    BP 111/74   Pulse 79   Temp 98.5 F (36.9 C) (Oral)   Resp 18   Ht 5' 4.5" (1.638 m)   Wt 184 lb 11.2 oz (83.8 kg)   LMP 04/19/2023 (Exact Date)   SpO2 100%   BMI 31.21 kg/m   Physical Exam Vitals and nursing note reviewed.  Constitutional:      General: She is not in acute distress.    Appearance: Normal appearance.  Cardiovascular:     Rate and Rhythm: Normal rate and regular rhythm.     Heart sounds: Normal heart sounds.  Pulmonary:     Effort: Pulmonary effort is normal.     Breath sounds: Normal breath sounds.  Skin:    General: Skin is warm and dry.  Neurological:     General: No focal deficit present.     Mental Status: She is alert. Mental status is at baseline.  Psychiatric:        Mood and Affect: Mood normal.        Behavior: Behavior normal.        Thought  Content: Thought content normal.        Judgment: Judgment normal.       Flowsheet Row Office Visit from  04/20/2023 in [facility]  PHQ-9 Total Score 7         04/20/2023    2:48 PM  GAD 7 : Generalized Anxiety Score  Nervous, Anxious, on Edge 1  Control/stop worrying 1  Worry too much - different things 1  Trouble relaxing 1  Restless 0  Easily annoyed or irritable 1  Afraid - awful might happen 0  Total GAD 7 Score 5  Anxiety Difficulty Somewhat difficult      Assessment & Plan:   Problem List Items Addressed This Visit     Muscle twitching   Right sided facial twitching that started after some changes and stressors. Symptoms present since end of November. Not present in clinic today. Labs offered today, will wait for CPE visit.  Flexeril 5 mg Q HS sent to pharmacy. She will follow-up in 2 weeks for CPE, will assess symptoms at that visit.       Relevant Medications   cyclobenzaprine (FLEXERIL) 5 MG tablet   Encounter for screening mammogram for malignant neoplasm of breast   Relevant Orders   MM DIGITAL SCREENING BILATERAL   Establishing care with new doctor, encounter for - Primary   GAD (generalized anxiety disorder)   Not currently taking medication for anxiety. Will continue to monitor. Gad-7: 5 today.       Agrees with plan of care discussed.  Questions answered.  Face to face time: 30 minutes discussing history and current symptoms.   Return in about 2 weeks (around 05/04/2023) for CPE with labs with pap .   Novella Olive, FNP

## 2023-04-20 NOTE — Assessment & Plan Note (Addendum)
 Right sided facial twitching that started after some changes and stressors. Symptoms present since end of November. Not present in clinic today. Labs offered today, will wait for CPE visit.  Flexeril 5 mg Q HS sent to pharmacy. She will follow-up in 2 weeks for CPE, will assess symptoms at that visit.

## 2023-05-02 ENCOUNTER — Encounter: Payer: 59 | Admitting: Family Medicine

## 2023-05-04 ENCOUNTER — Encounter: Payer: Self-pay | Admitting: Family Medicine

## 2023-05-04 ENCOUNTER — Ambulatory Visit (INDEPENDENT_AMBULATORY_CARE_PROVIDER_SITE_OTHER): Payer: BC Managed Care – PPO | Admitting: Family Medicine

## 2023-05-04 VITALS — BP 100/68 | HR 79 | Temp 97.6°F | Ht 64.5 in | Wt 181.5 lb

## 2023-05-04 DIAGNOSIS — Z124 Encounter for screening for malignant neoplasm of cervix: Secondary | ICD-10-CM | POA: Insufficient documentation

## 2023-05-04 DIAGNOSIS — R253 Fasciculation: Secondary | ICD-10-CM | POA: Diagnosis not present

## 2023-05-04 DIAGNOSIS — Z8639 Personal history of other endocrine, nutritional and metabolic disease: Secondary | ICD-10-CM | POA: Diagnosis not present

## 2023-05-04 DIAGNOSIS — Z1159 Encounter for screening for other viral diseases: Secondary | ICD-10-CM | POA: Insufficient documentation

## 2023-05-04 DIAGNOSIS — Z0000 Encounter for general adult medical examination without abnormal findings: Secondary | ICD-10-CM

## 2023-05-04 DIAGNOSIS — Z13220 Encounter for screening for lipoid disorders: Secondary | ICD-10-CM

## 2023-05-04 DIAGNOSIS — Z136 Encounter for screening for cardiovascular disorders: Secondary | ICD-10-CM

## 2023-05-04 DIAGNOSIS — Z1329 Encounter for screening for other suspected endocrine disorder: Secondary | ICD-10-CM

## 2023-05-04 DIAGNOSIS — Z13228 Encounter for screening for other metabolic disorders: Secondary | ICD-10-CM

## 2023-05-04 NOTE — Progress Notes (Signed)
 Complete physical exam  Patient: Tammy Marshall   DOB: 22-Jun-1975   48 y.o. Female  MRN: [id number]  Subjective:    Chief Complaint  Patient presents with   Annual Exam    CPE including blood work and PAP==kph    Tammy Marshall is a 48 y.o. female who presents today for a complete physical exam. She reports consuming a  high protein  diet. The patient does not participate in regular exercise at present. She generally feels well. She reports sleeping fairly well. She does not have additional problems to discuss today.    Most recent fall risk assessment:    04/20/2023    2:47 PM  Fall Risk   Falls in the past year? 0  Number falls in past yr: 0  Injury with Fall? 0  Risk for fall due to : No Fall Risks  Follow up Falls evaluation completed     Most recent depression screenings:    04/20/2023    2:47 PM 04/25/2022    9:38 AM  PHQ 2/9 Scores  PHQ - 2 Score 0 2  PHQ- 9 Score 7 7    Vision:Within last year and Dental: No current dental problems and Receives regular dental care    Patient Care Team: Novella Olive, FNP as PCP - General (Family Medicine)   Outpatient Medications Prior to Visit  Medication Sig   magnesium oxide (MAG-OX) 400 (240 Mg) MG tablet Take 400 mg by mouth daily.   Semaglutide 1.5 MG TABS Take 1.5 mg by mouth daily.   cyclobenzaprine (FLEXERIL) 5 MG tablet Take 1 tablet (5 mg total) by mouth at bedtime. (Patient not taking: Reported on 05/04/2023)   No facility-administered medications prior to visit.    ROS        Objective:     BP 100/68   Pulse 79   Temp 97.6 F (36.4 C)   Ht 5' 4.5" (1.638 m)   Wt 181 lb 8 oz (82.3 kg)   LMP 04/19/2023 (Exact Date)   SpO2 100%   BMI 30.67 kg/m  BP Readings from Last 3 Encounters:  05/04/23 100/68  04/20/23 111/74  07/04/22 104/66      Physical Exam Vitals and nursing note reviewed. Exam conducted with a chaperone present.  Constitutional:      General: She is not in acute distress.     Appearance: Normal appearance.  HENT:     Right Ear: Tympanic membrane normal.     Left Ear: Tympanic membrane normal.     Nose: Nose normal.     Mouth/Throat:     Mouth: Mucous membranes are moist.     Pharynx: Oropharynx is clear.  Eyes:     Extraocular Movements: Extraocular movements intact.  Neck:     Thyroid: No thyroid tenderness.  Cardiovascular:     Rate and Rhythm: Normal rate and regular rhythm.     Pulses:          Radial pulses are 2+ on the right side and 2+ on the left side.     Heart sounds: Normal heart sounds, S1 normal and S2 normal.  Pulmonary:     Effort: Pulmonary effort is normal.     Breath sounds: Normal breath sounds.  Chest:  Breasts:    Right: Normal.     Left: Normal.  Abdominal:     General: Bowel sounds are normal.     Palpations: Abdomen is soft.     Tenderness: There is no  abdominal tenderness.  Genitourinary:    General: Normal vulva.     Exam position: Lithotomy position.     Vagina: Normal.     Cervix: Normal.     Adnexa: Right adnexa normal and left adnexa normal.  Musculoskeletal:        General: Normal range of motion.     Cervical back: Normal range of motion.     Right lower leg: No edema.     Left lower leg: No edema.  Lymphadenopathy:     Cervical:     Right cervical: No superficial cervical adenopathy.    Left cervical: No superficial cervical adenopathy.     Upper Body:     Right upper body: No supraclavicular or axillary adenopathy.     Left upper body: No supraclavicular or axillary adenopathy.  Skin:    General: Skin is warm and dry.  Neurological:     General: No focal deficit present.     Mental Status: She is alert. Mental status is at baseline.  Psychiatric:        Mood and Affect: Mood normal.        Behavior: Behavior normal.        Thought Content: Thought content normal.        Judgment: Judgment normal.      No results found for any visits on 05/04/23.     Assessment & Plan:    Routine Health  Maintenance and Physical Exam  Immunization History  Administered Date(s) Administered   Moderna Sars-Covid-2 Vaccination 10/19/2019, 11/16/2019   Rabies, IM 11/24/2010, 11/27/2010, 12/01/2010, 12/08/2010, 12/22/2010   Tdap 11/24/2010    Health Maintenance  Topic Date Due   COVID-19 Vaccine (3 - 2024-25 season) 05/20/2023 (Originally 10/23/2022)   INFLUENZA VACCINE  05/22/2023 (Originally 09/22/2022)   DTaP/Tdap/Td (2 - Td or Tdap) 05/03/2024 (Originally 11/23/2020)   Cervical Cancer Screening (HPV/Pap Cotest)  03/01/2026   Colonoscopy  07/04/2027   Hepatitis C Screening  Completed   HIV Screening  Completed   HPV VACCINES  Aged Out    Discussed health benefits of physical activity, and encouraged her to engage in regular exercise appropriate for her age and condition.  Annual physical exam -     CBC with Differential/Platelet -     Comprehensive metabolic panel  Screening for cervical cancer -     IGP, Aptima HPV  Encounter for lipid screening for cardiovascular disease -     Lipid panel  Encounter for screening for metabolic disorder -     Comprehensive metabolic panel -     Hemoglobin A1c  Screening for thyroid disorder -     TSH + free T4  Muscle twitching  History of vitamin D deficiency -     VITAMIN D 25 Hydroxy (Vit-D Deficiency, Fractures)      Routine labs ordered.  HCM reviewed/discussed. Pap smear today. Anticipatory guidance regarding healthy weight, lifestyle and choices given. Recommend healthy diet.  Recommend approximately 150 minutes/week of moderate intensity exercise. Resistance training is good for building muscles and for bone health. Muscle mass helps to increase our metabolism and to burn more calories at rest.  Limit alcohol consumption: no more than one drink per day for women and 2 drinks per day for me. Recommend regular dental and vision exams. Always use seatbelt/lap and shoulder restraints. Recommend using smoke alarms and checking  batteries at least twice a year. Recommend using sunscreen when outside.  Please know that I am here to help you  with all of your health care goals and am happy to work with you to find a solution that works best for you.  The greatest advice I have received with any changes in life are to take it one step at a time, that even means if all you can focus on is the next 60 seconds, then do that and celebrate your victories.  With any changes in life, you will have set backs, and that is OK. The important thing to remember is, if you have a set back, it is not a failure, it is an opportunity to try again! Agrees with plan of care discussed.  Questions answered.      Return in about 1 year (around 05/03/2024) for CPE with labs.     Novella Olive, FNP

## 2023-05-05 ENCOUNTER — Encounter: Payer: Self-pay | Admitting: Family Medicine

## 2023-05-05 LAB — LIPID PANEL
Chol/HDL Ratio: 3.8 ratio (ref 0.0–4.4)
Cholesterol, Total: 190 mg/dL (ref 100–199)
HDL: 50 mg/dL (ref >39)
LDL Chol Calc (NIH): 118 mg/dL — ABNORMAL HIGH (ref 0–99)
Triglycerides: 121 mg/dL (ref 0–149)
VLDL Cholesterol Cal: 22 mg/dL (ref 5–40)

## 2023-05-05 LAB — HEMOGLOBIN A1C
Est. average glucose Bld gHb Est-mCnc: 114 mg/dL
Hgb A1c MFr Bld: 5.6 % (ref 4.8–5.6)

## 2023-05-05 LAB — CBC WITH DIFFERENTIAL/PLATELET
Basophils Absolute: 0 10*3/uL (ref 0.0–0.2)
Basos: 1 %
EOS (ABSOLUTE): 0.2 10*3/uL (ref 0.0–0.4)
Eos: 3 %
Hematocrit: 42.9 % (ref 34.0–46.6)
Hemoglobin: 14.3 g/dL (ref 11.1–15.9)
Immature Grans (Abs): 0 10*3/uL (ref 0.0–0.1)
Immature Granulocytes: 0 %
Lymphocytes Absolute: 2.2 10*3/uL (ref 0.7–3.1)
Lymphs: 34 %
MCH: 29.5 pg (ref 26.6–33.0)
MCHC: 33.3 g/dL (ref 31.5–35.7)
MCV: 89 fL (ref 79–97)
Monocytes Absolute: 0.4 10*3/uL (ref 0.1–0.9)
Monocytes: 6 %
Neutrophils Absolute: 3.7 10*3/uL (ref 1.4–7.0)
Neutrophils: 56 %
Platelets: 350 10*3/uL (ref 150–450)
RBC: 4.85 x10E6/uL (ref 3.77–5.28)
RDW: 12.6 % (ref 11.7–15.4)
WBC: 6.5 10*3/uL (ref 3.4–10.8)

## 2023-05-05 LAB — COMPREHENSIVE METABOLIC PANEL
ALT: 13 IU/L (ref 0–32)
AST: 16 IU/L (ref 0–40)
Albumin: 4.3 g/dL (ref 3.9–4.9)
Alkaline Phosphatase: 71 IU/L (ref 44–121)
BUN/Creatinine Ratio: 10 (ref 9–23)
BUN: 9 mg/dL (ref 6–24)
Bilirubin Total: 0.3 mg/dL (ref 0.0–1.2)
CO2: 23 mmol/L (ref 20–29)
Calcium: 9.7 mg/dL (ref 8.7–10.2)
Chloride: 103 mmol/L (ref 96–106)
Creatinine, Ser: 0.89 mg/dL (ref 0.57–1.00)
Globulin, Total: 2.9 g/dL (ref 1.5–4.5)
Glucose: 99 mg/dL (ref 70–99)
Potassium: 4.7 mmol/L (ref 3.5–5.2)
Sodium: 138 mmol/L (ref 134–144)
Total Protein: 7.2 g/dL (ref 6.0–8.5)
eGFR: 80 mL/min/{1.73_m2} (ref >59)

## 2023-05-05 LAB — VITAMIN D 25 HYDROXY (VIT D DEFICIENCY, FRACTURES): Vit D, 25-Hydroxy: 39.9 ng/mL (ref 30.0–100.0)

## 2023-05-05 LAB — TSH+FREE T4
Free T4: 0.99 ng/dL (ref 0.82–1.77)
TSH: 3.66 u[IU]/mL (ref 0.450–4.500)

## 2023-05-11 LAB — IGP, APTIMA HPV
HPV Aptima: NEGATIVE
PAP Smear Comment: 0

## 2023-05-25 ENCOUNTER — Ambulatory Visit

## 2023-05-25 DIAGNOSIS — Z1231 Encounter for screening mammogram for malignant neoplasm of breast: Secondary | ICD-10-CM | POA: Diagnosis not present

## 2023-05-26 ENCOUNTER — Encounter: Payer: Self-pay | Admitting: Family Medicine

## 2023-07-04 ENCOUNTER — Other Ambulatory Visit: Payer: Self-pay | Admitting: Family Medicine

## 2023-07-04 ENCOUNTER — Encounter: Payer: Self-pay | Admitting: Family Medicine

## 2023-07-04 DIAGNOSIS — R253 Fasciculation: Secondary | ICD-10-CM

## 2023-07-06 DIAGNOSIS — Z1331 Encounter for screening for depression: Secondary | ICD-10-CM | POA: Diagnosis not present

## 2023-07-06 DIAGNOSIS — G5131 Clonic hemifacial spasm, right: Secondary | ICD-10-CM | POA: Diagnosis not present

## 2023-07-06 DIAGNOSIS — G5603 Carpal tunnel syndrome, bilateral upper limbs: Secondary | ICD-10-CM | POA: Diagnosis not present

## 2023-07-11 DIAGNOSIS — G5131 Clonic hemifacial spasm, right: Secondary | ICD-10-CM | POA: Diagnosis not present

## 2023-07-24 DIAGNOSIS — G5603 Carpal tunnel syndrome, bilateral upper limbs: Secondary | ICD-10-CM | POA: Diagnosis not present

## 2023-11-06 ENCOUNTER — Ambulatory Visit: Admitting: Family Medicine

## 2023-11-06 ENCOUNTER — Encounter: Payer: Self-pay | Admitting: Family Medicine

## 2023-11-06 VITALS — BP 118/80 | HR 92 | Temp 98.7°F | Ht 64.0 in | Wt 165.0 lb

## 2023-11-06 DIAGNOSIS — K5732 Diverticulitis of large intestine without perforation or abscess without bleeding: Secondary | ICD-10-CM | POA: Diagnosis not present

## 2023-11-06 MED ORDER — CIPROFLOXACIN HCL 500 MG PO TABS: 500.0000 mg | ORAL_TABLET | Freq: Two times a day (BID) | ORAL | 0 refills | Status: AC

## 2023-11-06 MED ORDER — METRONIDAZOLE 500 MG PO TABS: 500.0000 mg | ORAL_TABLET | Freq: Four times a day (QID) | ORAL | 0 refills | Status: AC

## 2023-11-06 NOTE — Patient Instructions (Signed)
 You were prescribed an antibiotic today. Please complete the full course.  Acetaminophen or ibuprofen  for fever according to package instructions, do not exceed recommended doses.  Adequate fluids to avoid dehydration. Lots of rest while you are recovering.  Follow-up if your symptoms do not improve.

## 2023-11-06 NOTE — Assessment & Plan Note (Addendum)
 Reports severe pain in LLQ with fever and vomiting. Last occurred on Wednesday. There is some LLQ tenderness upon palpation. Reviewed recent colonoscopy which reveals multiple diverticula. Will treat for presumed diverticulitis. Metronidazole  500 mg QID and ciprofloxacin  500 mg BID for 10 days. CBC/diff today.  Symptoms reviewed that warrant seeking higher level of care. Vital signs are stable and she does not look toxic.  Follow-up as needed.

## 2023-11-06 NOTE — Progress Notes (Signed)
 Acute Office Visit  Subjective:     Patient ID: Larisha Vencill, female    DOB: Sep 04, 1975, 48 y.o.   MRN: [id number]  Chief Complaint  Patient presents with   Abdominal Pain    Pain like you have to go to the bathroom, throwing up, sweating and was running a fever this last episode.  Regular poo then watery. Been going on about 2+ years but getting more frequently. Have had a colonoscopy. Have not taken anything    HPI Patient is in today for abdominal pain. Symptoms present for 2 years, more frequently. Pain in LLQ Sweating. Vomiting. Ran fever 100.0 Last occurred on Wednesday. Colonoscopy in May 2024: multiple diverticula in sigmoid colon found.     Review of Systems  Constitutional:  Positive for fever.  Respiratory:  Negative for shortness of breath.   Cardiovascular:  Negative for chest pain.  Gastrointestinal:  Positive for abdominal pain and vomiting.        Objective:    BP 118/80   Pulse 92   Temp 98.7 F (37.1 C) (Oral)   Ht 5' 4 (1.626 m)   Wt 165 lb (74.8 kg)   LMP 10/29/2023 (Approximate)   SpO2 99%   BMI 28.32 kg/m    Physical Exam Vitals and nursing note reviewed.  Constitutional:      General: She is not in acute distress.    Appearance: She is well-developed. She is not ill-appearing or toxic-appearing.  Cardiovascular:     Rate and Rhythm: Normal rate and regular rhythm.     Heart sounds: Normal heart sounds.  Pulmonary:     Breath sounds: Normal breath sounds.  Abdominal:     General: Bowel sounds are normal.     Palpations: Abdomen is soft.     Tenderness: There is abdominal tenderness (LLQ). There is no guarding or rebound.  Skin:    General: Skin is warm and dry.  Neurological:     General: No focal deficit present.     Mental Status: She is alert. Mental status is at baseline.  Psychiatric:        Mood and Affect: Mood normal.        Behavior: Behavior normal.        Thought Content: Thought content normal.         Judgment: Judgment normal.     No results found for any visits on 11/06/23.      Assessment & Plan:   Problem List Items Addressed This Visit     Diverticulitis of large intestine without bleeding - Primary   Reports severe pain in LLQ with fever and vomiting. Last occurred on Wednesday. There is some LLQ tenderness upon palpation. Reviewed recent colonoscopy which reveals multiple diverticula. Will treat for presumed diverticulitis. Metronidazole  500 mg QID and ciprofloxacin  500 mg BID for 10 days. CBC/diff today.  Symptoms reviewed that warrant seeking higher level of care. Vital signs are stable and she does not look toxic.  Follow-up as needed.        Relevant Medications   metroNIDAZOLE  (FLAGYL ) 500 MG tablet   ciprofloxacin  (CIPRO ) 500 MG tablet   Other Relevant Orders   CBC with Differential/Platelet    Meds ordered this encounter  Medications   metroNIDAZOLE  (FLAGYL ) 500 MG tablet    Sig: Take 1 tablet (500 mg total) by mouth 4 (four) times daily for 10 days.    Dispense:  40 tablet    Refill:  0    Supervising  Provider:   METHENEY, CATHERINE D [[id number]]   ciprofloxacin  (CIPRO ) 500 MG tablet    Sig: Take 1 tablet (500 mg total) by mouth 2 (two) times daily for 10 days.    Dispense:  20 tablet    Refill:  0    Supervising Provider:   METHENEY, CATHERINE D [[id number]]  Agrees with plan of care discussed.  Questions answered.   Return if symptoms worsen or fail to improve.  Darice JONELLE Brownie, FNP

## 2023-11-07 ENCOUNTER — Ambulatory Visit: Payer: Self-pay | Admitting: Family Medicine

## 2023-11-07 LAB — CBC WITH DIFFERENTIAL/PLATELET
Basophils Absolute: 0.1 x10E3/uL (ref 0.0–0.2)
Basos: 1 %
EOS (ABSOLUTE): 0.2 x10E3/uL (ref 0.0–0.4)
Eos: 2 %
Hematocrit: 42.1 % (ref 34.0–46.6)
Hemoglobin: 14.2 g/dL (ref 11.1–15.9)
Immature Grans (Abs): 0 x10E3/uL (ref 0.0–0.1)
Immature Granulocytes: 0 %
Lymphocytes Absolute: 2.5 x10E3/uL (ref 0.7–3.1)
Lymphs: 31 %
MCH: 30 pg (ref 26.6–33.0)
MCHC: 33.7 g/dL (ref 31.5–35.7)
MCV: 89 fL (ref 79–97)
Monocytes Absolute: 0.6 x10E3/uL (ref 0.1–0.9)
Monocytes: 7 %
Neutrophils Absolute: 4.9 x10E3/uL (ref 1.4–7.0)
Neutrophils: 59 %
Platelets: 341 x10E3/uL (ref 150–450)
RBC: 4.73 x10E6/uL (ref 3.77–5.28)
RDW: 12.7 % (ref 11.7–15.4)
WBC: 8.2 x10E3/uL (ref 3.4–10.8)

## 2024-05-06 ENCOUNTER — Encounter: Admitting: Family Medicine
